# Patient Record
Sex: Female | Born: 1955 | Race: Black or African American | Hispanic: No | Marital: Single | Smoking: Never smoker
Health system: Southern US, Community
[De-identification: ages and names within clinical notes are randomized; demographics above are authoritative.]

## PROBLEM LIST (undated history)

## (undated) DIAGNOSIS — I10 Essential (primary) hypertension: Secondary | ICD-10-CM

---

## 2016-10-12 DIAGNOSIS — M21611 Bunion of right foot: Secondary | ICD-10-CM | POA: Insufficient documentation

## 2016-10-12 DIAGNOSIS — L608 Other nail disorders: Secondary | ICD-10-CM | POA: Insufficient documentation

## 2019-05-18 ENCOUNTER — Other Ambulatory Visit: Payer: Self-pay

## 2019-05-18 DIAGNOSIS — Z1231 Encounter for screening mammogram for malignant neoplasm of breast: Secondary | ICD-10-CM

## 2019-06-09 ENCOUNTER — Ambulatory Visit
Admission: RE | Admit: 2019-06-09 | Discharge: 2019-06-09 | Disposition: A | Discharge status: Home / Self Care | Payer: No Typology Code available for payment source | Source: Ambulatory Visit | Attending: Obstetrics and Gynecology | Admitting: Obstetrics and Gynecology

## 2019-06-09 ENCOUNTER — Ambulatory Visit: Payer: Self-pay | Admitting: Student

## 2019-06-09 ENCOUNTER — Other Ambulatory Visit: Payer: Self-pay

## 2019-06-09 ENCOUNTER — Encounter: Payer: Self-pay | Admitting: Student

## 2019-06-09 ENCOUNTER — Other Ambulatory Visit: Payer: Self-pay | Admitting: Obstetrics and Gynecology

## 2019-06-09 VITALS — BP 158/96 | Temp 97.8°F | Wt 149.0 lb

## 2019-06-09 DIAGNOSIS — N6310 Unspecified lump in the right breast, unspecified quadrant: Secondary | ICD-10-CM

## 2019-06-09 DIAGNOSIS — Z1231 Encounter for screening mammogram for malignant neoplasm of breast: Secondary | ICD-10-CM

## 2019-06-09 DIAGNOSIS — Z01419 Encounter for gynecological examination (general) (routine) without abnormal findings: Secondary | ICD-10-CM

## 2019-06-09 NOTE — Progress Notes (Signed)
[name] is a 64 y.o. No obstetric history on file. female who presents to [facility] clinic today with complaint of lump in right breast that has been present for about a month.    Pap Smear: Pap smear completed today. Last Pap smear was 2016 [place] and was normal. Per patient has no history of an abnormal Pap smear. Last Pap smear result is not available in Epic.   Physical exam: Breasts Breasts symmetrical. No skin abnormalities bilateral breasts. No nipple retraction bilateral breasts. No nipple discharge bilateral breasts. No lymphadenopathy. No lumps palpated bilateral breasts.  Some dense tissue palpated in bilateral outer breasts & right breast next to sternum.       Pelvic/Bimanual Ext Genitalia No lesions, no swelling and no discharge observed on external genitalia.        Vagina Vagina pink and normal texture. No lesions or discharge observed in vagina.        Cervix Cervix is present. Cervix pink and of normal texture. No discharge observed.    Uterus Uterus is present and palpable. Uterus in normal position and normal size.        Adnexae Bilateral ovaries present and palpable. No tenderness on palpation.         Rectovaginal No rectal exam completed today since patient had no rectal complaints. No skin abnormalities observed on exam.     Smoking History: Patient has never smoked.    Patient Navigation: Patient education provided. Access to services provided for patient through [facility] program.    Colorectal Cancer Screening: Per patient has had colonoscopy completed in 2019 No complaints today.    Breast and Cervical Cancer Risk Assessment: Patient does not have family history of breast cancer, known genetic mutations, or radiation treatment to the chest before age 60. Patient does not have history of cervical dysplasia, immunocompromised, or DES exposure in-utero.  Risk Assessment    Risk Scores      06/09/2019   Last edited by: Narda Rutherford, LPN   5-year risk: 1.5 %   Lifetime risk: 6.5 %          A: [facility] exam with pap smear Complaint of right breast lump  P: [facility] for a screening mammogram. Appointment scheduled later today.  Judeth Horn, NP 06/09/2019 1:53 PM

## 2019-06-16 ENCOUNTER — Ambulatory Visit
Admission: RE | Admit: 2019-06-16 | Discharge: 2019-06-16 | Disposition: A | Discharge status: Home / Self Care | Payer: No Typology Code available for payment source | Source: Ambulatory Visit | Attending: Obstetrics and Gynecology | Admitting: Obstetrics and Gynecology

## 2019-06-16 ENCOUNTER — Other Ambulatory Visit: Payer: Self-pay

## 2019-06-16 ENCOUNTER — Ambulatory Visit: Payer: No Typology Code available for payment source

## 2019-06-16 DIAGNOSIS — N6310 Unspecified lump in the right breast, unspecified quadrant: Secondary | ICD-10-CM

## 2019-06-23 LAB — CYTOLOGY - PAP
Adequacy: ABSENT
Comment: NEGATIVE
Diagnosis: NEGATIVE
High risk HPV: NEGATIVE

## 2020-09-15 ENCOUNTER — Telehealth: Payer: Self-pay | Admitting: Internal Medicine

## 2020-09-15 NOTE — Telephone Encounter (Signed)
LVM informing pt provider will be virtual for mondays appt no need to come into office the provider will call phone for the visit

## 2020-09-19 ENCOUNTER — Telehealth (INDEPENDENT_AMBULATORY_CARE_PROVIDER_SITE_OTHER): Payer: Self-pay

## 2020-09-19 ENCOUNTER — Telehealth: Payer: No Typology Code available for payment source | Admitting: Internal Medicine

## 2020-09-19 NOTE — Telephone Encounter (Signed)
Left voicemail informing patient that she will need to establish care first. Then get financial assistance application. Advised to call office back and schedule/reschedule new patient appt. Maryjean Morn, CMA    Copied from CRM [id number]. Topic: Appointment Scheduling - Scheduling Inquiry for Clinic >> Sep 15, 2020  1:16 PM Randol Kern wrote: Reason for CRM: Pt wants to be considered for financial counseling. Please advise   Best contact: [phone]

## 2020-11-10 ENCOUNTER — Encounter: Payer: Self-pay | Admitting: Internal Medicine

## 2020-11-10 ENCOUNTER — Other Ambulatory Visit: Payer: Self-pay

## 2020-11-10 ENCOUNTER — Ambulatory Visit: Payer: 59 | Attending: Internal Medicine | Admitting: Internal Medicine

## 2020-11-10 VITALS — BP 140/90 | HR 88 | Resp 16 | Ht 62.0 in | Wt 143.0 lb

## 2020-11-10 DIAGNOSIS — Z2821 Immunization not carried out because of patient refusal: Secondary | ICD-10-CM | POA: Diagnosis not present

## 2020-11-10 DIAGNOSIS — E663 Overweight: Secondary | ICD-10-CM | POA: Diagnosis not present

## 2020-11-10 DIAGNOSIS — Z1231 Encounter for screening mammogram for malignant neoplasm of breast: Secondary | ICD-10-CM

## 2020-11-10 DIAGNOSIS — R030 Elevated blood-pressure reading, without diagnosis of hypertension: Secondary | ICD-10-CM | POA: Diagnosis not present

## 2020-11-10 DIAGNOSIS — K7689 Other specified diseases of liver: Secondary | ICD-10-CM | POA: Diagnosis not present

## 2020-11-10 DIAGNOSIS — Z1159 Encounter for screening for other viral diseases: Secondary | ICD-10-CM

## 2020-11-10 DIAGNOSIS — Z23 Encounter for immunization: Secondary | ICD-10-CM

## 2020-11-10 DIAGNOSIS — Z7689 Persons encountering health services in other specified circumstances: Secondary | ICD-10-CM

## 2020-11-10 NOTE — Patient Instructions (Signed)
Your blood pressure is elevated.  Normal blood pressure is 120/80 or lower.  Try to limit the salt in the foods as much as possible.  Check your blood pressure at least once a week and record your readings and bring them with you on your next visit.  Healthy Eating Following a healthy eating pattern may help you to achieve and maintain a healthy body weight, reduce the risk of chronic disease, and live a long and productive life. It is important to follow a healthy eating pattern at an appropriate calorie level for your body. Your nutritional needs should be met primarily through food by choosing a variety of nutrient-rich foods. What are tips for following this plan? Reading food labels Read labels and choose the following: Reduced or low sodium. Juices with 100% fruit juice. Foods with low saturated fats and high polyunsaturated and monounsaturated fats. Foods with whole grains, such as whole wheat, cracked wheat, brown rice, and wild rice. Whole grains that are fortified with folic acid. This is recommended for women who are pregnant or who want to become pregnant. Read labels and avoid the following: Foods with a lot of added sugars. These include foods that contain brown sugar, corn sweetener, corn syrup, dextrose, fructose, glucose, high-fructose corn syrup, honey, invert sugar, lactose, malt syrup, maltose, molasses, raw sugar, sucrose, trehalose, or turbinado sugar. Do not eat more than the following amounts of added sugar per day: 6 teaspoons (25 g) for women. 9 teaspoons (38 g) for men. Foods that contain processed or refined starches and grains. Refined grain products, such as white flour, degermed cornmeal, white bread, and white rice. Shopping Choose nutrient-rich snacks, such as vegetables, whole fruits, and nuts. Avoid high-calorie and high-sugar snacks, such as potato chips, fruit snacks, and candy. Use oil-based dressings and spreads on foods instead of solid fats such as  butter, stick margarine, or cream cheese. Limit pre-made sauces, mixes, and "instant" products such as flavored rice, instant noodles, and ready-made pasta. Try more plant-protein sources, such as tofu, tempeh, black beans, edamame, lentils, nuts, and seeds. Explore eating plans such as the Mediterranean diet or vegetarian diet. Cooking Use oil to saut or stir-fry foods instead of solid fats such as butter, stick margarine, or lard. Try baking, boiling, grilling, or broiling instead of frying. Remove the fatty part of meats before cooking. Steam vegetables in water or broth. Meal planning  At meals, imagine dividing your plate into fourths: One-half of your plate is fruits and vegetables. One-fourth of your plate is whole grains. One-fourth of your plate is protein, especially lean meats, poultry, eggs, tofu, beans, or nuts. Include low-fat dairy as part of your daily diet. Lifestyle Choose healthy options in all settings, including home, work, school, restaurants, or stores. Prepare your food safely: Wash your hands after handling raw meats. Keep food preparation surfaces clean by regularly washing with hot, soapy water. Keep raw meats separate from ready-to-eat foods, such as fruits and vegetables. Cook seafood, meat, poultry, and eggs to the recommended internal temperature. Store foods at safe temperatures. In general: Keep cold foods at 52F (4.4C) or below. Keep hot foods at 152F (60C) or above. Keep your freezer at [facility] (-17.8C) or below. Foods are no longer safe to eat when they have been between the temperatures of 40-152F (4.4-60C) for more than 2 hours. What foods should I eat? Fruits Aim to eat 2 cup-equivalents of fresh, canned (in natural juice), or frozen fruits each day. Examples of 1 cup-equivalent of fruit include  1 small apple, 8 large strawberries, 1 cup canned fruit,  cup dried fruit, or 1 cup 100% juice. Vegetables Aim to eat 2-3 cup-equivalents of fresh  and frozen vegetables each day, including different varieties and colors. Examples of 1 cup-equivalent of vegetables include 2 medium carrots, 2 cups raw, leafy greens, 1 cup chopped vegetable (raw or cooked), or 1 medium baked potato. Grains Aim to eat 6 ounce-equivalents of whole grains each day. Examples of 1 ounce-equivalent of grains include 1 slice of bread, 1 cup ready-to-eat cereal, 3 cups popcorn, or  cup cooked rice, pasta, or cereal. Meats and other proteins Aim to eat 5-6 ounce-equivalents of protein each day. Examples of 1 ounce-equivalent of protein include 1 egg, 1/2 cup nuts or seeds, or 1 tablespoon (16 g) peanut butter. A cut of meat or fish that is the size of a deck of cards is about 3-4 ounce-equivalents. Of the protein you eat each week, try to have at least 8 ounces come from seafood. This includes salmon, trout, herring, and anchovies. Dairy Aim to eat 3 cup-equivalents of fat-free or low-fat dairy each day. Examples of 1 cup-equivalent of dairy include 1 cup (240 mL) milk, 8 ounces (250 g) yogurt, 1 ounces (44 g) natural cheese, or 1 cup (240 mL) fortified soy milk. Fats and oils Aim for about 5 teaspoons (21 g) per day. Choose monounsaturated fats, such as canola and olive oils, avocados, peanut butter, and most nuts, or polyunsaturated fats, such as sunflower, corn, and soybean oils, walnuts, pine nuts, sesame seeds, sunflower seeds, and flaxseed. Beverages Aim for six 8-oz glasses of water per day. Limit coffee to three to five 8-oz cups per day. Limit caffeinated beverages that have added calories, such as soda and energy drinks. Limit alcohol intake to no more than 1 drink a day for nonpregnant women and 2 drinks a day for men. One drink equals 12 oz of beer (355 mL), 5 oz of wine (148 mL), or 1 oz of hard liquor (44 mL). Seasoning and other foods Avoid adding excess amounts of salt to your foods. Try flavoring foods with herbs and spices instead of salt. Avoid adding  sugar to foods. Try using oil-based dressings, sauces, and spreads instead of solid fats. This information is based on general [place] nutrition guidelines. For more information, visit [employer].es. Exact amounts may vary based on your nutrition needs. Summary A healthy eating plan may help you to maintain a healthy weight, reduce the risk of chronic diseases, and stay active throughout your life. Plan your meals. Make sure you eat the right portions of a variety of nutrient-rich foods. Try baking, boiling, grilling, or broiling instead of frying. Choose healthy options in all settings, including home, work, school, restaurants, or stores. This information is not intended to replace advice given to you by your health care provider. Make sure you discuss any questions you have with your health care provider. Document Revised: 05/20/2017 Document Reviewed: 05/20/2017 Elsevier Patient Education  [place].

## 2020-11-10 NOTE — Progress Notes (Signed)
Patient ID: Mandy Shelton, female    DOB: Sep 17, 1955  MRN: [id number]  CC: New Patient (Initial Visit)   Subjective: Mandy Shelton is a 65 y.o. female who presents for new pt visit Her concerns today include:    Previous PCP was at a [facility] in [facility] [place].  She states she has not been seen there in a few years.  She now has insurance and lives in [place] so decided to establish with [place].   No chronic medical issues Not on any rxn med  She is overweight for height.  Started exercising again recently and has loss 7 lbs over the past 2 wks.  Goes to a Recreation ctr for swimming and aerobic classes 2 days a week.  Doing good with eating habits.  Getting in fruits and veggies.  Drinking more water  She tells me that she had an MRI of her abdomen in 2013 of 2014 when she lived in [place] because she was having abdominal pain in her left upper abdomen.  Incidental finding was a small cyst on the liver.  She states being told that she may have to keep an eye on that.  She is not having any symptoms at this time.    Blood pressure noted to be elevated today.  She tells me that she was told by her previous doctor that her blood pressure was elevated and was prescribed medication.  However she never took it.  She states when she went back her blood pressure had came down and so she was told to keep an eye on it.  She does have a home blood pressure monitoring device but has not checked it in a while.  HM:  had c-scope in 2020.  1 polyp removed.  Told repeat in 5 yrs Due for MMG.  Had mammogram done back in 2021.  She thought she felt a lump in the left breast but mammogram was negative.  She also had an ultrasound that was negative.  She received a letter informing her that she has dense breasts and that her next mammogram should be 3D tomosynthesis Declines flu shot Agrees to Tdap Declines COVID vaccine Sexually active with same partner since 2014.  Had negative  HIV test at [facility] 04/2020 in [facility]. Agrees to hep C screening Due for pap    Current Outpatient Medications on File Prior to Visit  Medication Sig Dispense Refill   Multiple Vitamins-Minerals (ONE-A-DAY FOR HER VITACRAVES PO) Take 1 tablet by mouth once.     No current facility-administered medications on file prior to visit.    Allergies  Allergen Reactions   Sulfa Antibiotics     Social History   Socioeconomic History   Marital status: Single    Spouse name: Not on file   Number of children: 2   Years of education: Not on file   Highest education level: Bachelor's degree (e.g., BA, AB, BS)  Occupational History   Not on file  Tobacco Use   Smoking status: Never   Smokeless tobacco: Never  Vaping Use   Vaping Use: Not on file  Substance and Sexual Activity   Alcohol use: Yes    Comment: occasionally   Drug use: Never   Sexual activity: Yes    Birth control/protection: Post-menopausal, None  Other Topics Concern   Not on file  Social History Narrative   Not on file   Social Determinants of Health   Financial Resource Strain: Not  on file  Food Insecurity: Not on file  Transportation Needs: Not on file  Physical Activity: Not on file  Stress: Not on file  Social Connections: Not on file  Intimate Partner Violence: Not on file    Family History  Problem Relation Age of Onset   Thyroid disease Mother     ROS: Review of Systems Negative except as stated above  PHYSICAL EXAM: BP 138/87   Pulse 88   Resp 16   Ht 5\' 2"  (1.575 m)   Wt 143 lb (64.9 kg)   SpO2 98%   BMI 26.16 kg/m   Physical Exam  General appearance - alert, well appearing, older African-American female and in no distress Mental status - normal mood, behavior, speech, dress, motor activity, and thought processes Eyes - pupils equal and reactive, extraocular eye movements intact Mouth - mucous membranes moist, pharynx normal without lesions Neck - supple, no significant  adenopathy Chest - clear to auscultation, no wheezes, rales or rhonchi, symmetric air entry Heart - normal rate, regular rhythm, normal S1, S2, no murmurs, rubs, clicks or gallops Abdomen - soft, nontender, nondistended, no masses or organomegaly Extremities - peripheral pulses normal, no pedal edema, no clubbing or cyanosis    ASSESSMENT AND PLAN: 1. Establishing care with new doctor, encounter for Patient to sign a release for to get her records from her previous PCP in [facility].  I also told her to sign a release for [facility] to get the MRI report that was done back in 2014 [place]  2. Elevated blood pressure reading without diagnosis of hypertension DASH diet discussed and encouraged.  Recommend that she check her blood pressure at least once or twice a week and bring her readings with her on next visit. - CBC - Comprehensive metabolic panel  3. Over weight Commended her on starting an exercise program.  Informed her that the goal is to get in at least 150 minutes/week total of moderate intensity exercise.  Dietary counseling given.  Printed information also given. - Comprehensive metabolic panel - Lipid panel  4. Need for Tdap vaccination Given today.  5. Need for hepatitis C screening test - Hepatitis C Antibody  6. Liver cyst Patient will try to get [place] a copy of the MRI report.  However I told her that if it was a small cyst then usually no further follow-up is needed as those are usually benign.  We will check baseline blood test today including CMP.  7. Encounter for screening mammogram for malignant neoplasm of breast Inform patient that I think the baseline mammograms now are 3D but she can verify with this person who will call her from [facility] imaging to schedule her mammography. - MM Digital Screening; Future  8. Influenza vaccination declined Strongly advised.  Patient declined.  9. COVID-19 vaccination declined Strongly advised.  Patient  declined.   Patient was given the opportunity to ask questions.  Patient verbalized understanding of the plan and was able to repeat key elements of the plan.   Orders Placed This Encounter  Procedures   MM Digital Screening   Tdap vaccine greater than or equal to 7yo IM   CBC   Comprehensive metabolic panel   Lipid panel   Hepatitis C Antibody     Requested Prescriptions    No prescriptions requested or ordered in this encounter    Return in about 6 weeks (around 12/22/2020), or for pap, for Sign release get your records from the [facility] in [facility].  Jonah Blue, MD, FACP

## 2020-11-11 LAB — COMPREHENSIVE METABOLIC PANEL
ALT: 15 IU/L (ref 0–32)
AST: 26 IU/L (ref 0–40)
Albumin/Globulin Ratio: 1.4 (ref 1.2–2.2)
Albumin: 4.7 g/dL (ref 3.8–4.8)
Alkaline Phosphatase: 65 IU/L (ref 44–121)
BUN/Creatinine Ratio: 10 — ABNORMAL LOW (ref 12–28)
BUN: 12 mg/dL (ref 8–27)
Bilirubin Total: 0.3 mg/dL (ref 0.0–1.2)
CO2: 25 mmol/L (ref 20–29)
Calcium: 9.9 mg/dL (ref 8.7–10.3)
Chloride: 99 mmol/L (ref 96–106)
Creatinine, Ser: 1.15 mg/dL — ABNORMAL HIGH (ref 0.57–1.00)
Globulin, Total: 3.4 g/dL (ref 1.5–4.5)
Glucose: 94 mg/dL (ref 65–99)
Potassium: 4.9 mmol/L (ref 3.5–5.2)
Sodium: 138 mmol/L (ref 134–144)
Total Protein: 8.1 g/dL (ref 6.0–8.5)
eGFR: 53 mL/min/{1.73_m2} — ABNORMAL LOW (ref >59)

## 2020-11-11 LAB — LIPID PANEL
Chol/HDL Ratio: 3.2 ratio (ref 0.0–4.4)
Cholesterol, Total: 218 mg/dL — ABNORMAL HIGH (ref 100–199)
HDL: 69 mg/dL (ref >39)
LDL Chol Calc (NIH): 132 mg/dL — ABNORMAL HIGH (ref 0–99)
Triglycerides: 99 mg/dL (ref 0–149)
VLDL Cholesterol Cal: 17 mg/dL (ref 5–40)

## 2020-11-11 LAB — CBC
Hematocrit: 39 % (ref 34.0–46.6)
Hemoglobin: 13 g/dL (ref 11.1–15.9)
MCH: 29.7 pg (ref 26.6–33.0)
MCHC: 33.3 g/dL (ref 31.5–35.7)
MCV: 89 fL (ref 79–97)
Platelets: 268 10*3/uL (ref 150–450)
RBC: 4.37 x10E6/uL (ref 3.77–5.28)
RDW: 12.5 % (ref 11.7–15.4)
WBC: 5.7 10*3/uL (ref 3.4–10.8)

## 2020-11-11 LAB — HEPATITIS C ANTIBODY: Hep C Virus Ab: <0.1 s/co ratio (ref 0.0–0.9)

## 2020-11-12 NOTE — Progress Notes (Signed)
Blood cell counts are normal. Liver function test normal. Kidney function not 100%.  We will plan to recheck on follow up visit in about 4 mths.  Avoid long term use of over the counter pain meds including Motrin, Advil, Aleve, Ibuprofen. Cholesterol level is 132 with goal being less than 100.  Healthy eating habits and regular exercise will help to lower cholesterol. Hep C screen negative. The rest of this is for my information The 10-year ASCVD risk score (Arnett DK, et al., 2019) is: 9.2%   Values used to calculate the score:     Age: 65 years     Sex: Female     Is Non-Hispanic African American: Yes     Diabetic: No     Tobacco smoker: No     Systolic Blood Pressure: 140 mmHg     Is BP treated: No     HDL Cholesterol: 69 mg/dL     Total Cholesterol: 218 mg/dL

## 2020-11-14 ENCOUNTER — Telehealth: Payer: Self-pay

## 2020-11-14 NOTE — Telephone Encounter (Signed)
Contacted pt to go over lab results pt is aware and doesn't have any questions or concerns 

## 2021-01-09 ENCOUNTER — Ambulatory Visit: Payer: No Typology Code available for payment source | Admitting: Internal Medicine

## 2021-01-10 ENCOUNTER — Other Ambulatory Visit: Payer: Self-pay

## 2021-01-10 ENCOUNTER — Ambulatory Visit
Admission: RE | Admit: 2021-01-10 | Discharge: 2021-01-10 | Disposition: A | Discharge status: Home / Self Care | Payer: 59 | Source: Ambulatory Visit | Attending: Internal Medicine | Admitting: Internal Medicine

## 2021-01-10 DIAGNOSIS — Z1231 Encounter for screening mammogram for malignant neoplasm of breast: Secondary | ICD-10-CM

## 2021-02-23 ENCOUNTER — Ambulatory Visit: Payer: Self-pay | Admitting: Internal Medicine

## 2021-04-27 ENCOUNTER — Ambulatory Visit: Payer: 59 | Attending: Internal Medicine | Admitting: Internal Medicine

## 2021-04-27 ENCOUNTER — Ambulatory Visit (HOSPITAL_COMMUNITY)
Admission: RE | Admit: 2021-04-27 | Discharge: 2021-04-27 | Disposition: A | Discharge status: Home / Self Care | Payer: 59 | Source: Ambulatory Visit | Attending: Internal Medicine | Admitting: Internal Medicine

## 2021-04-27 ENCOUNTER — Other Ambulatory Visit: Payer: Self-pay

## 2021-04-27 ENCOUNTER — Encounter: Payer: Self-pay | Admitting: Internal Medicine

## 2021-04-27 ENCOUNTER — Other Ambulatory Visit (HOSPITAL_COMMUNITY)
Admission: RE | Admit: 2021-04-27 | Discharge: 2021-04-27 | Disposition: A | Discharge status: Home / Self Care | Payer: 59 | Source: Ambulatory Visit | Attending: Internal Medicine | Admitting: Internal Medicine

## 2021-04-27 VITALS — BP 145/96 | HR 72 | Ht 62.0 in | Wt 143.2 lb

## 2021-04-27 DIAGNOSIS — I10 Essential (primary) hypertension: Secondary | ICD-10-CM

## 2021-04-27 DIAGNOSIS — Z124 Encounter for screening for malignant neoplasm of cervix: Secondary | ICD-10-CM | POA: Insufficient documentation

## 2021-04-27 DIAGNOSIS — K7689 Other specified diseases of liver: Secondary | ICD-10-CM

## 2021-04-27 DIAGNOSIS — M25562 Pain in left knee: Secondary | ICD-10-CM | POA: Insufficient documentation

## 2021-04-27 MED ORDER — AMLODIPINE BESYLATE 5 MG PO TABS: 5.0000 mg | ORAL_TABLET | Freq: Every day | ORAL | 5 refills | Status: DC

## 2021-04-27 NOTE — Progress Notes (Signed)
? ? ?Patient ID: Mandy Shelton, female    DOB: [birth year]  MRN: [id number] ? ?CC: Follow-up (Follow up and pap) ? ? ?Subjective: ?Mandy Shelton is a 66 y.o. female who presents for f/u and PAP.  Last seen 10/2020 ?Her concerns today include:  ?Pt with overwgh, liver cyst, elev BP ? ?GYN History:  ?Pt is G5P2 (3 abortions) ?Any hx of abn paps?: no.  Last PAP 05/2019 was negative but transformation zone was absent ?Menses regular or irregular?: menopausal.  No post menopausal bleeding ?How long does menses last? NA ?Menstrual flow light or heavy?:  ?Method of birth control?:  NA ?Any vaginal dischg at this time?: no ?Dysuria?: no ?Any hx of STI?:  hx of Trich, GN and Chlamydia. Last was 2014 ?Sexually active with how many partners: one female partner since 2015 ?Desires STI screen: yes ?Last MMG:  12/2020 ?Family hx of uterine, cervical or breast cancer?:  no ?  ?BP elev on last visit and again today.  She has not been checking lately ?Limiting salt in foods ?Joined gym end of January 2023.  Going 5 days a wk.  Walks on TM for 45 mins.  Some stiffness and pain in LT knee x 1 mth. Sometimes it feels heavy.  Not sure if any swelling. Pain over knee cap and behind knee ? ?Question about liver cyst. On last visit pt reported the following:   ?She tells me that she had an MRI of her abdomen in 2013 of 2014 when she lived in [place] because she was having abdominal pain in her left upper abdomen.  Incidental finding was a small cyst on the liver.  She states being told that she may have to keep an eye on that.  She is not having any symptoms at this time.   ?-On that visit I told patient that it would be good if we can get a copy of the MRI reports.  Usually if it is a small cyst on the liver, no further work-up is needed as these are usually benign.  I requested that she sign a release for [place] to get the report.  However patient states she forgot to do so when she went through checkout. ? ?Had c-scope in early 2019 at [facility].  I do not see the colonoscopy report but I see where she did meet with GI and there is a subsequent telephone note dated 06/19/2017 stating that benign precancerous polyp and that she needs a repeat colonoscopy in 5 years.   ? ?Current Outpatient Medications on File Prior to Visit  ?Medication Sig Dispense Refill  ? Calcium Carb-Cholecalciferol 600-20 MG-MCG TABS calcium carbonate-vitamin D3 600 mg (1,500 mg)-800 unit tablet ? Take 1 tablet twice a day by oral route for 30 days.    ? Multiple Vitamins-Minerals (ONE-A-DAY FOR HER VITACRAVES PO) Take 1 tablet by mouth once.    ? ?No current facility-administered medications on file prior to visit.  ? ? ?Allergies  ?Allergen Reactions  ? Sulfa Antibiotics   ? ? ?Social History  ? ?Socioeconomic History  ? Marital status: Single  ?  Spouse name: Not on file  ? Number of children: 2  ? Years of education: Not on file  ? Highest education level: Bachelor's degree (e.g., BA, AB, BS)  ?Occupational History  ? Not on file  ?Tobacco Use  ? Smoking status: Never  ? Smokeless tobacco: Never  ?Vaping Use  ? Vaping Use: Not on file  ?  Substance and Sexual Activity  ? Alcohol use: Yes  ?  Comment: occasionally  ? Drug use: Never  ? Sexual activity: Yes  ?  Birth control/protection: Post-menopausal, None  ?Other Topics Concern  ? Not on file  ?Social History Narrative  ? Not on file  ? ?Social Determinants of Health  ? ?Financial Resource Strain: Not on file  ?Food Insecurity: Not on file  ?Transportation Needs: Not on file  ?Physical Activity: Not on file  ?Stress: Not on file  ?Social Connections: Not on file  ?Intimate Partner Violence: Not on file  ? ? ?Family History  ?Problem Relation Age of Onset  ? Thyroid disease Mother   ? Breast cancer Neg Hx   ? ? ?No past surgical history on file. ? ?ROS: ?Review of Systems ?Negative except as stated above ? ?PHYSICAL EXAM: ?BP (!) 145/96   Pulse 72   Ht 5\' 2"  (1.575 m)   Wt 143 lb 3.2 oz (65 kg)   SpO2 100%   BMI  26.19 kg/m?   ?Wt Readings from Last 3 Encounters:  ?04/27/21 143 lb 3.2 oz (65 kg)  ?11/10/20 143 lb (64.9 kg)  ?06/09/19 149 lb (67.6 kg)  ? ? ?Physical Exam ?BP 167/92 ?General appearance - alert, well appearing, and in no distress ?Mental status - normal mood, behavior, speech, dress, motor activity, and thought processes ?Chest - clear to auscultation, no wheezes, rales or rhonchi, symmetric air entry ?Heart - normal rate, regular rhythm, normal S1, S2, no murmurs, rubs, clicks or gallops ?Breasts - CMA Carly Warner Mccreedy present for breast and pelvic exam:  breasts appear normal, no suspicious masses, no skin or nipple changes or axillary nodes ?Pelvic - normal external genitalia, vulva, vagina, cervix, uterus and adnexa ? ? ?CMP Latest Ref Rng & Units 11/10/2020  ?Glucose 65 - 99 mg/dL 94  ?BUN 8 - 27 mg/dL 12  ?Creatinine 0.57 - 1.00 mg/dL 1.15(H)  ?Sodium 134 - 144 mmol/L 138  ?Potassium 3.5 - 5.2 mmol/L 4.9  ?Chloride 96 - 106 mmol/L 99  ?CO2 20 - 29 mmol/L 25  ?Calcium 8.7 - 10.3 mg/dL 9.9  ?Total Protein 6.0 - 8.5 g/dL 8.1  ?Total Bilirubin 0.0 - 1.2 mg/dL 0.3  ?Alkaline Phos 44 - 121 IU/L 65  ?AST 0 - 40 IU/L 26  ?ALT 0 - 32 IU/L 15  ? ?Lipid Panel  ?   ?Component Value Date/Time  ? CHOL 218 (H) 11/10/2020 1533  ? TRIG 99 11/10/2020 1533  ? HDL 69 11/10/2020 1533  ? CHOLHDL 3.2 11/10/2020 1533  ? LDLCALC 132 (H) 11/10/2020 1533  ? ? ?CBC ?   ?Component Value Date/Time  ? WBC 5.7 11/10/2020 1533  ? RBC 4.37 11/10/2020 1533  ? HGB 13.0 11/10/2020 1533  ? HCT 39.0 11/10/2020 1533  ? PLT 268 11/10/2020 1533  ? MCV 89 11/10/2020 1533  ? MCH 29.7 11/10/2020 1533  ? MCHC 33.3 11/10/2020 1533  ? RDW 12.5 11/10/2020 1533  ? ? ?ASSESSMENT AND PLAN: ? ?1. Pap smear for cervical cancer screening ?- Cervicovaginal ancillary only ?- Cytology - PAP ? ?2. Essential hypertension ?DASH diet discussed and encouraged.  Start low-dose amlodipine.  Advised to check blood pressure at least once a week if she has access to her device  with goal being 130/80 or lower. ?F/u with clinical pharmacist in 1 mth for repeat BP check ?- amLODipine (NORVASC) 5 MG tablet; Take 1 tablet (5 mg total) by mouth daily.  Dispense: 30  tablet; Refill: 5 ? ?3. Left knee pain, unspecified chronicity ?Questionable etiology.  Could be mild arthritis being manifested now that she is trying to be more active.  Encouraged to stay active ?We will start with x-rays of the knee.  ? I recommend purchasing Voltaren gel over-the-counter and using it as needed. ?- DG Knee Complete 4 Views Left; Future ? ?4. Liver cyst ?Advised patient to sign a release at checkout for [place] to get her records from [place].  She is agreeable to doing that. ? ? ? ?Patient was given the opportunity to ask questions.  Patient verbalized understanding of the plan and was able to repeat key elements of the plan.  ? ?This documentation was completed using Radio producer.  Any transcriptional errors are unintentional. ? ?No orders of the defined types were placed in this encounter. ? ? ? ?Requested Prescriptions  ? ? No prescriptions requested or ordered in this encounter  ? ? ?No follow-ups on file. ? ?Karle Plumber, MD, FACP ?

## 2021-04-27 NOTE — Patient Instructions (Signed)
You can go to the radiology department at [facility] to get the x-ray done on the left knee.  You can go any day except on the weekends. ?Purchase Voltaren gel over-the-counter and use it on the left knee as needed for pain. ? ?Please sign a release for [place] to get your records including the CAT scan report about the liver cysts. ? ?We have started you on a blood pressure medication called amlodipine 5 mg daily.  We will have you follow-up with our clinical pharmacist in 1 month for repeat blood pressure check. ? ? ?

## 2021-04-28 LAB — CERVICOVAGINAL ANCILLARY ONLY
Bacterial Vaginitis (gardnerella): POSITIVE — AB
Candida Glabrata: NEGATIVE
Candida Vaginitis: POSITIVE — AB
Chlamydia: NEGATIVE
Comment: NEGATIVE
Comment: NEGATIVE
Comment: NEGATIVE
Comment: NEGATIVE
Comment: NEGATIVE
Comment: NORMAL
Neisseria Gonorrhea: NEGATIVE
Trichomonas: NEGATIVE

## 2021-04-29 ENCOUNTER — Other Ambulatory Visit: Payer: Self-pay | Admitting: Internal Medicine

## 2021-04-29 MED ORDER — FLUCONAZOLE 150 MG PO TABS: 150.0000 mg | ORAL_TABLET | Freq: Every day | ORAL | 0 refills | Status: DC

## 2021-04-29 MED ORDER — METRONIDAZOLE 500 MG PO TABS: 500.0000 mg | ORAL_TABLET | Freq: Two times a day (BID) | ORAL | 0 refills | Status: DC

## 2021-05-01 LAB — CYTOLOGY - PAP
Comment: NEGATIVE
Diagnosis: NEGATIVE
High risk HPV: NEGATIVE

## 2021-05-02 ENCOUNTER — Telehealth: Payer: Self-pay

## 2021-05-02 ENCOUNTER — Ambulatory Visit: Payer: Self-pay

## 2021-05-02 NOTE — Telephone Encounter (Signed)
Patient name and DOB verified. Aware of results of lab test.  ?And what medications are prescribed for.  ?

## 2021-05-02 NOTE — Telephone Encounter (Signed)
Called patient reviewed all information and repeated back to me. Will call if any questions.  ?Will pick up prescription today. ?

## 2021-05-02 NOTE — Telephone Encounter (Signed)
Left message to return call to our office.  

## 2021-05-02 NOTE — Telephone Encounter (Signed)
Pt returned call. Shared result notes. ? ?Ladell Pier, MD  ?04/28/2021  8:45 AM EST   ?  ?Let patient know that x-ray of the knee showed mild arthritis changes.  She should use the Voltaren gel which can be purchased over-the-counter as discussed on recent visit.  If no relief with that, she can let me know so that we can refer to orthopedics.  ? ? ?Pt will call back if Voltaren is ineffective. ?

## 2021-05-02 NOTE — Telephone Encounter (Signed)
?  Chief Complaint: Medication questions ?Symptoms: na ?Frequency: na ?Pertinent Negatives: Patient denies na ?Disposition: [] ED /[] Urgent Care (no appt availability in office) / [] Appointment(In office/virtual)/ []  [place] [facility]/ [] Home Care/ [] Refused Recommended Disposition /[] [place] [facility]/ [x]  Follow-up with PCP ?Additional Notes: 2 medications were called into [facility] - pt does not know what they are for. Please return her call. Regarding diagnosis and treatment. ? ?Reason for Disposition ? [1] Caller has NON-URGENT medicine question about med that PCP prescribed AND [2] triager unable to answer question ? ?Answer Assessment - Initial Assessment Questions ?1. NAME of MEDICATION: "What medicine are you calling about?" ?    fluconazole (DIFLUCAN) 150 MG tablet   ?metroNIDAZOLE (FLAGYL) 500 MG tablet  ?2. QUESTION: "What is your question?" (e.g., double dose of medicine, side effect) ?    Why were these medications called in? ?3. PRESCRIBING HCP: "Who prescribed it?" Reason: if prescribed by specialist, call should be referred to that group. ?    Dr. ?4. SYMPTOMS: "Do you have any symptoms?" ?    na ?5. SEVERITY: If symptoms are present, ask "Are they mild, moderate or severe?" ?    na ?6. PREGNANCY:  "Is there any chance that you are pregnant?" "When was your last menstrual period?" ?    na ? ?Protocols used: Medication Question Call-A-AH ? ?

## 2021-05-03 ENCOUNTER — Telehealth: Payer: Self-pay

## 2021-05-03 NOTE — Telephone Encounter (Signed)
Contacted pt to go over lab and xray results pt doesn't have any questions or concerns  ?

## 2021-05-25 ENCOUNTER — Encounter: Payer: Self-pay | Admitting: Pharmacist

## 2021-05-25 ENCOUNTER — Ambulatory Visit: Payer: 59 | Attending: Internal Medicine | Admitting: Pharmacist

## 2021-05-25 VITALS — BP 147/77

## 2021-05-25 DIAGNOSIS — I10 Essential (primary) hypertension: Secondary | ICD-10-CM | POA: Diagnosis not present

## 2021-05-25 MED ORDER — AMLODIPINE BESYLATE 10 MG PO TABS: 10.0000 mg | ORAL_TABLET | Freq: Every day | ORAL | 1 refills | Status: DC

## 2021-05-25 NOTE — Progress Notes (Signed)
? ?  S:    ? ?No chief complaint on file. ? ? ?Mandy Shelton is a 66 y.o. female who presents for hypertension evaluation, education, and management. PMH is significant for HTN. Patient was referred and last seen by Primary Care Provider, Dr. Wynetta Emery, on 04/27/21. BP was 145/96 mmHg so Dr. Wynetta Emery started amlodipine 5 mg daily.   ? ?Today, patient arrives in good spirits and presents without assistance. Denies dizziness, headache, swelling. Does endorse blurred vision.  ? ?Patient reports hypertension was diagnosed ~2017. ? ?Family/Social history:  ?Fhx: thyroid disease ?Tobacco: never smoker  ?Alcohol: none reported ? ?Medication adherence reported. Patient has taken BP medications today.  ? ?Current antihypertensives include: amlodipine 5 mg daily  ? ?Reported home BP readings:  ?-None  ? ?Patient reported dietary habits:  ?-Sodium: compliant with salt restriction for the most part. Uses Mrs. Dash here lately.  ?-Caffeine: drinks coffee in the mornings  ? ?Patient-reported exercise habits:  ?-Joined [employer], 45 minutes on the treadmill  ? ?O:  ?Vitals:  ? 05/25/21 1435  ?BP: (!) 147/77  ? ?Last 3 Office BP readings: ?BP Readings from Last 3 Encounters:  ?05/25/21 (!) 147/77  ?04/27/21 (!) 145/96  ?11/10/20 140/90  ? ?BMET ?   ?Component Value Date/Time  ? NA 138 11/10/2020 1533  ? K 4.9 11/10/2020 1533  ? CL 99 11/10/2020 1533  ? CO2 25 11/10/2020 1533  ? GLUCOSE 94 11/10/2020 1533  ? BUN 12 11/10/2020 1533  ? CREATININE 1.15 (H) 11/10/2020 1533  ? CALCIUM 9.9 11/10/2020 1533  ? ? ?Renal function: ?CrCl cannot be calculated (Patient's most recent lab result is older than the maximum 21 days allowed.). ? ?Clinical ASCVD: No  ?The 10-year ASCVD risk score (Arnett DK, et al., 2019) is: 12.2% ?  Values used to calculate the score: ?    Age: 60 years ?    Sex: Female ?    Is Non-Hispanic African American: Yes ?    Diabetic: No ?    Tobacco smoker: No ?    Systolic Blood Pressure: [id number] mmHg ?    Is BP treated: Yes ?     HDL Cholesterol: 69 mg/dL ?    Total Cholesterol: 218 mg/dL ? ?A/P: ?Hypertension diagnosed currently above goal on current medications. BP goal < 130/80 mmHg. Medication adherence appears appropriate.  ?-Increased dose of amlodipine to 10 mg daily.  ?-Counseled on lifestyle modifications for blood pressure control including reduced dietary sodium, increased exercise, adequate sleep. ?-Encouraged patient to check BP at home and bring log of readings to next visit. Counseled on proper use of home BP cuff.  ? ?Results reviewed and written information provided. Patient verbalized understanding of treatment plan. Total time in face-to-face counseling 30 minutes.  ? ?F/u clinic visit in 1 month. ? ?Benard Halsted, PharmD, BCACP, CPP ?Clinical Pharmacist ?[place] ?[phone] ? ? ?

## 2021-06-29 ENCOUNTER — Ambulatory Visit: Payer: Medicare Other | Admitting: Pharmacist

## 2021-07-25 DIAGNOSIS — Z113 Encounter for screening for infections with a predominantly sexual mode of transmission: Secondary | ICD-10-CM | POA: Diagnosis not present

## 2021-07-25 DIAGNOSIS — N390 Urinary tract infection, site not specified: Secondary | ICD-10-CM | POA: Diagnosis not present

## 2021-07-31 ENCOUNTER — Ambulatory Visit: Payer: Self-pay

## 2021-07-31 NOTE — Telephone Encounter (Signed)
  Chief Complaint: Back pain Symptoms: Back pain on right lower back 9/10,  a little nauseous Frequency: since yesterday Pertinent Negatives: Patient denies fever Disposition: [x] ED /[] Urgent Care (no appt availability in office) / [] Appointment(In office/virtual)/ []  [place] [facility]/ [] Home Care/ [] Refused Recommended Disposition /[] [place] [facility]/ []  Follow-up with PCP Additional Notes: PT was sen at HD for UTI and given medications on Friday. Pt now has 9/10 back pain. Reason for Disposition  [1] SEVERE back pain (e.g., excruciating) AND [2] sudden onset AND [3] age > 60 years  Answer Assessment - Initial Assessment Questions 1. ONSET: "When did the pain begin?"      2015 - was diagnosed with spot of liver 2. LOCATION: "Where does it hurt?" (upper, mid or lower back)     Lower right side 3. SEVERITY: "How bad is the pain?"  (e.g., Scale 1-10; mild, moderate, or severe)   - MILD (1-3): doesn't interfere with normal activities    - MODERATE (4-7): interferes with normal activities or awakens from sleep    - SEVERE (8-10): excruciating pain, unable to do any normal activities      9/10 4. PATTERN: "Is the pain constant?" (e.g., yes, no; constant, intermittent)      Constant 5. RADIATION: "Does the pain shoot into your legs or elsewhere?"     Dose not radiate 6. CAUSE:  "What do you think is causing the back pain?"      unsure 7. BACK OVERUSE:  "Any recent lifting of heavy objects, strenuous work or exercise?"     no 8. MEDICATIONS: "What have you taken so far for the pain?" (e.g., nothing, acetaminophen, NSAIDS)     no 9. NEUROLOGIC SYMPTOMS: "Do you have any weakness, numbness, or problems with bowel/bladder control?"     no 10. OTHER SYMPTOMS: "Do you have any other symptoms?" (e.g., fever, abdominal pain, burning with urination, blood in urine)       no 11. PREGNANCY: "Is there any chance you are pregnant?" (e.g., yes, no; LMP)       no  Protocols used: Back  Pain-A-AH

## 2021-08-02 ENCOUNTER — Emergency Department (HOSPITAL_COMMUNITY): Payer: Medicare Other

## 2021-08-02 ENCOUNTER — Emergency Department (HOSPITAL_COMMUNITY)
Admission: EM | Admit: 2021-08-02 | Discharge: 2021-08-03 | Discharge status: Against Medical Advice | Payer: Medicare Other | Attending: Emergency Medicine | Admitting: Emergency Medicine

## 2021-08-02 ENCOUNTER — Other Ambulatory Visit: Payer: Self-pay

## 2021-08-02 DIAGNOSIS — Z5321 Procedure and treatment not carried out due to patient leaving prior to being seen by health care provider: Secondary | ICD-10-CM | POA: Insufficient documentation

## 2021-08-02 DIAGNOSIS — R109 Unspecified abdominal pain: Secondary | ICD-10-CM | POA: Diagnosis not present

## 2021-08-02 DIAGNOSIS — R1031 Right lower quadrant pain: Secondary | ICD-10-CM | POA: Diagnosis not present

## 2021-08-02 DIAGNOSIS — M25551 Pain in right hip: Secondary | ICD-10-CM | POA: Diagnosis not present

## 2021-08-02 DIAGNOSIS — N281 Cyst of kidney, acquired: Secondary | ICD-10-CM | POA: Diagnosis not present

## 2021-08-02 DIAGNOSIS — K760 Fatty (change of) liver, not elsewhere classified: Secondary | ICD-10-CM | POA: Diagnosis not present

## 2021-08-02 LAB — CBC WITH DIFFERENTIAL/PLATELET
Abs Immature Granulocytes: 0.01 10*3/uL (ref 0.00–0.07)
Basophils Absolute: 0.1 10*3/uL (ref 0.0–0.1)
Basophils Relative: 1 %
Eosinophils Absolute: 0.2 10*3/uL (ref 0.0–0.5)
Eosinophils Relative: 2 %
HCT: 39.4 % (ref 36.0–46.0)
Hemoglobin: 12.8 g/dL (ref 12.0–15.0)
Immature Granulocytes: 0 %
Lymphocytes Relative: 49 %
Lymphs Abs: 3.2 10*3/uL (ref 0.7–4.0)
MCH: 29.4 pg (ref 26.0–34.0)
MCHC: 32.5 g/dL (ref 30.0–36.0)
MCV: 90.4 fL (ref 80.0–100.0)
Monocytes Absolute: 0.5 10*3/uL (ref 0.1–1.0)
Monocytes Relative: 8 %
Neutro Abs: 2.7 10*3/uL (ref 1.7–7.7)
Neutrophils Relative %: 40 %
Platelets: 222 10*3/uL (ref 150–400)
RBC: 4.36 MIL/uL (ref 3.87–5.11)
RDW: 12.3 % (ref 11.5–15.5)
WBC: 6.6 10*3/uL (ref 4.0–10.5)
nRBC: 0 % (ref 0.0–0.2)

## 2021-08-02 LAB — COMPREHENSIVE METABOLIC PANEL
ALT: 16 U/L (ref 0–44)
AST: 19 U/L (ref 15–41)
Albumin: 4 g/dL (ref 3.5–5.0)
Alkaline Phosphatase: 48 U/L (ref 38–126)
Anion gap: 10 (ref 5–15)
BUN: 9 mg/dL (ref 8–23)
CO2: 24 mmol/L (ref 22–32)
Calcium: 9.3 mg/dL (ref 8.9–10.3)
Chloride: 103 mmol/L (ref 98–111)
Creatinine, Ser: 0.98 mg/dL (ref 0.44–1.00)
GFR, Estimated: >60 mL/min (ref >60)
Glucose, Bld: 90 mg/dL (ref 70–99)
Potassium: 4 mmol/L (ref 3.5–5.1)
Sodium: 137 mmol/L (ref 135–145)
Total Bilirubin: 0.8 mg/dL (ref 0.3–1.2)
Total Protein: 7.7 g/dL (ref 6.5–8.1)

## 2021-08-02 LAB — URINALYSIS, ROUTINE W REFLEX MICROSCOPIC
Bilirubin Urine: NEGATIVE
Glucose, UA: NEGATIVE mg/dL
Hgb urine dipstick: NEGATIVE
Ketones, ur: NEGATIVE mg/dL
Leukocytes,Ua: NEGATIVE
Nitrite: NEGATIVE
Protein, ur: NEGATIVE mg/dL
Specific Gravity, Urine: 1.006 (ref 1.005–1.030)
pH: 6 (ref 5.0–8.0)

## 2021-08-02 MED ORDER — IOHEXOL 300 MG/ML  SOLN
100.0000 mL | Freq: Once | INTRAMUSCULAR | Status: AC | PRN
  Administered 2021-08-02: 100 mL via INTRAVENOUS

## 2021-08-02 NOTE — ED Provider Triage Note (Signed)
Emergency Medicine Provider Triage Evaluation Note  Mandy Shelton , a 66 y.o. female  was evaluated in triage.  Pt complains of right lower quadrant and right-sided flank pain worse over the past 3 days.  States this has been an ongoing issue.  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  She states she had an MRI and the distant past and stated she had a spot on the liver that needed to be monitored.  Review of Systems  Positive: As above Negative: As above  Physical Exam  BP (!) 148/91 (BP Location: Right Arm)   Pulse 81   Temp 98.4 F (36.9 C) (Oral)   Resp 16   SpO2 100%  Gen:   Awake, no distress   Resp:  Normal effort  MSK:   Moves extremities without difficulty  Other:  Right lower quadrant abdominal pain  Medical Decision Making  Medically screening exam initiated at 4:50 PM.  Appropriate orders placed.  Mandy Shelton was informed that the remainder of the evaluation will be completed by another provider, this initial triage assessment does not replace that evaluation, and the importance of remaining in the ED until their evaluation is complete.     Marita [place], PA-C 08/02/21 1651

## 2021-08-02 NOTE — ED Triage Notes (Signed)
Pt from home for eval of RLQ, R hip, and R flank pain x several years, worsening in the last three days.

## 2021-08-03 ENCOUNTER — Telehealth: Payer: Self-pay | Admitting: Internal Medicine

## 2021-08-03 ENCOUNTER — Ambulatory Visit: Payer: Medicare Other | Attending: Internal Medicine | Admitting: Pharmacist

## 2021-08-03 VITALS — BP 142/84 | HR 71

## 2021-08-03 DIAGNOSIS — I10 Essential (primary) hypertension: Secondary | ICD-10-CM | POA: Diagnosis not present

## 2021-08-03 DIAGNOSIS — H538 Other visual disturbances: Secondary | ICD-10-CM

## 2021-08-03 NOTE — Telephone Encounter (Signed)
-----   Message from Drucilla Chalet, RPH-CPP sent at 08/03/2021  2:40 PM EDT ----- Mandy Shelton Dr. Laural Benes,   We have been seeing this patient for BP management. Today, she endorses occasional blurred vision but denies any dizziness or HA. She tells me this started way before she was having "issues with my blood pressure" but it doesn't seem like she has discussed this during office visits. Would you be willing to make an ophthalmology referral?

## 2021-08-03 NOTE — ED Notes (Signed)
Pt left due to long wait times  

## 2021-08-03 NOTE — Progress Notes (Signed)
   S:   Mandy Shelton is a 66 y.o. female who presents for hypertension evaluation, education, and management. PMH is significant for HTN (since ~2017). Patient was referred and last seen by Primary Care Provider, Dr. Laural Benes, on 04/27/21. Last seen by pharmacist on 05/25/2021 where patient was tolerating amlodpine start and dose was increased to 10mg . Of note, patient called nurse triage line on 07/31/2021 with back pain and was referred to ED based on screening questions. Yesterday, patient presented to ED for excruciating pain but left before seen due to long wait.   Today, patient arrives in good spirits and presents without assistance. Patient's pain has improved without OTC pain medications since yesterday. Since last visit, has been taking amlodipine daily, but cuts into 1/2 tablet (5mg ) most days. Of note, during yesterday took 10mg  dose, at ED visit BP 125/47. Has not checked home BP. Denies dizziness, headache, swelling.    Current antihypertensives include: amlodipine 10 mg daily   Family/Social history:  Fhx: thyroid disease Tobacco: never smoker  Alcohol: none reported  O:  Vitals:   08/03/21 1446  BP: (!) 142/84  Pulse: 71  Took amlodipine 5mg  (1/2 tablet) today  Last 3 Office BP readings: BP Readings from Last 3 Encounters:  08/03/21 (!) 142/84  08/03/21 125/74  05/25/21 (!) 147/77   BMET    Component Value Date/Time   NA 137 08/02/2021 1654   NA 138 11/10/2020 1533   K 4.0 08/02/2021 1654   CL 103 08/02/2021 1654   CO2 24 08/02/2021 1654   GLUCOSE 90 08/02/2021 1654   BUN 9 08/02/2021 1654   BUN 12 11/10/2020 1533   CREATININE 0.98 08/02/2021 1654   CALCIUM 9.3 08/02/2021 1654   GFRNONAA >60 08/02/2021 1654   Renal function: CrCl cannot be calculated (Unknown ideal weight.).  Clinical ASCVD: No  The 10-year ASCVD risk score (Arnett DK, et al., 2019) is: 11.9%   Values used to calculate the score:     Age: 77 years     Sex: Female     Is Non-Hispanic  African American: Yes     Diabetic: No     Tobacco smoker: No     Systolic Blood Pressure: 142 mmHg     Is BP treated: Yes     HDL Cholesterol: 69 mg/dL     Total Cholesterol: 218 mg/dL  A/P: Hypertension diagnosed currently above goal on current medications. BP goal < 130/80 mmHg. Medication adherence appears sub-optimal taking 1/2 tablet most days. When taking 10mg  dose, BP appears to be at goal.  -Continue amlodipine to 10 mg daily.  -Counseled on lifestyle modifications for blood pressure control including reduced dietary sodium, increased exercise, adequate sleep. -Encouraged patient to check BP at home and bring log of readings to next visit.  -Appropriate lifestyle habits to improve blood pressure. Continue limiting salt intake and regular exercise.  -Encouraged to follow up with eye doctor  -Offered resources for mobile unit if needed for acute visits  Results reviewed and written information provided. Patient verbalized understanding of treatment plan. Total time in face-to-face counseling 30 minutes.   F/u clinic visit in 1 month with PCP and 2 months with pharmacist.   Thank you for allowing pharmacy to participate in this patient's care.  08/04/2021, PharmD PGY1 Acute Care Resident  08/03/2021,2:49 PM

## 2021-08-03 NOTE — Telephone Encounter (Signed)
Referral submitted to ophthalmology. 

## 2021-08-04 NOTE — Telephone Encounter (Signed)
Called pt and made a tele phone visit for the 27th with [facility]

## 2021-08-09 ENCOUNTER — Ambulatory Visit: Payer: Medicare Other | Admitting: Critical Care Medicine

## 2021-08-09 VITALS — BP 128/77 | HR 88 | Temp 98.0°F | Resp 18 | Ht 62.0 in | Wt 138.0 lb

## 2021-08-09 DIAGNOSIS — M25551 Pain in right hip: Secondary | ICD-10-CM

## 2021-08-09 DIAGNOSIS — M545 Low back pain, unspecified: Secondary | ICD-10-CM

## 2021-08-09 MED ORDER — MELOXICAM 15 MG PO TABS: 15.0000 mg | ORAL_TABLET | Freq: Every day | ORAL | 0 refills | Status: DC

## 2021-08-09 NOTE — Progress Notes (Signed)
Acute Office Visit  Subjective:     Patient ID: Mandy Shelton, female    DOB: 09/05/1955, 66 y.o.   MRN: [id number]  Chief Complaint  Patient presents with   Pain    R Side    HPI Patient is in today for right-sided pain This is a primary care patient of Dr. Laural Benes.  Patient is noted several days worth of right-sided flank pain right hip pain and groin pain worse with leg movement.  She does have chronic left knee pain in the past with arthritis.  Patient went to the emergency room on the 14th concerned about potential gallbladder disease.  She left without being seen after 11 hours.  Work-up there was unrevealing.  Labs were unremarkable urinalysis was negative there was no gallbladder disease there was mild steatosis in the liver.  Patient denies any fever she does have difficulty with starting and stopping her stream with urination.  She has not been evaluated overactive bladder in the past.  Patient does have history of hypertension she maintains amlodipine 10 mg daily.  On arrival blood pressure is good 128/77.    Review of Systems  HENT: Negative.    Respiratory: Negative.    Cardiovascular: Negative.   Gastrointestinal:  Positive for abdominal pain.  Genitourinary:  Positive for flank pain, frequency and urgency. Negative for dysuria and hematuria.       Incont of urine  Musculoskeletal:  Positive for back pain.  Skin: Negative.   Neurological: Negative.   Psychiatric/Behavioral: Negative.          Objective:    BP 128/77 (BP Location: Left Arm, Patient Position: Sitting, Cuff Size: Normal)   Pulse 88   Temp 98 F (36.7 C)   Resp 18   Ht 5\' 2"  (1.575 m)   Wt 138 lb (62.6 kg)   SpO2 100%   BMI 25.24 kg/m    Physical Exam Vitals reviewed.  Constitutional:      Appearance: Normal appearance. She is well-developed. She is not diaphoretic.  HENT:     Head: Normocephalic and atraumatic.     Nose: No nasal deformity, septal deviation, mucosal edema or  rhinorrhea.     Right Sinus: No maxillary sinus tenderness or frontal sinus tenderness.     Left Sinus: No maxillary sinus tenderness or frontal sinus tenderness.     Mouth/Throat:     Pharynx: No oropharyngeal exudate.  Eyes:     General: No scleral icterus.    Conjunctiva/sclera: Conjunctivae normal.     Pupils: Pupils are equal, round, and reactive to light.  Neck:     Thyroid: No thyromegaly.     Vascular: No carotid bruit or JVD.     Trachea: Trachea normal. No tracheal tenderness or tracheal deviation.  Cardiovascular:     Rate and Rhythm: Normal rate and regular rhythm.     Chest Wall: PMI is not displaced.     Pulses: Normal pulses. No decreased pulses.     Heart sounds: Normal heart sounds, S1 normal and S2 normal. Heart sounds not distant. No murmur heard.    No systolic murmur is present.     No diastolic murmur is present.     No friction rub. No gallop. No S3 or S4 sounds.  Pulmonary:     Effort: No tachypnea, accessory muscle usage or respiratory distress.     Breath sounds: No stridor. No decreased breath sounds, wheezing, rhonchi or rales.  Chest:     Chest  wall: No tenderness.  Abdominal:     General: Bowel sounds are normal. There is no distension.     Palpations: Abdomen is soft. Abdomen is not rigid. There is no mass.     Tenderness: There is no abdominal tenderness. There is no guarding or rebound.     Hernia: No hernia is present.  Musculoskeletal:        General: Tenderness present. Normal range of motion.     Cervical back: Normal range of motion and neck supple. No edema, erythema or rigidity. No muscular tenderness. Normal range of motion.     Comments: Tender over the bursa of the right hip and also upper portion of the hip area at the top of the pelvis however the bladder is not tender and the upper abdominal area is unremarkable  Lymphadenopathy:     Head:     Right side of head: No submental or submandibular adenopathy.     Left side of head: No  submental or submandibular adenopathy.     Cervical: No cervical adenopathy.  Skin:    General: Skin is warm and dry.     Coloration: Skin is not pale.     Findings: No rash.     Nails: There is no clubbing.  Neurological:     Mental Status: She is alert and oriented to person, place, and time.     Sensory: No sensory deficit.  Psychiatric:        Mood and Affect: Mood normal.        Speech: Speech normal.        Behavior: Behavior normal.        Thought Content: Thought content normal.     No results found for any visits on 08/09/21.      Assessment & Plan:   Problem List Items Addressed This Visit   None   No orders of the defined types were placed in this encounter.   No follow-ups on file.  Shan Levans, MD

## 2021-08-09 NOTE — Patient Instructions (Signed)
Begin meloxicam 1 pill daily 30 you were given the sent to your [facility] for inflammation  X-rays of the right hip pelvis and lumbar spine will be obtained at [facility] you can go anytime tomorrow at your convenience there is no appointment needed   You might have an overactive bladder however I will defer to Dr. Laural Benes  Physical therapy consultation and sports medicine consultation may be options  I suspect this is bursitis of the right hip or this could be inflammation of the lumbar spine  Follow-up with Dr. Laural Benes on this problem in the next 3 to 4 weeks  There is medication for overactive bladder but I would prefer Dr. Laural Benes fully assess that  No additional labs are needed all labs were done in the emergency room and were normal including urinalysis  CT scan suggest this is not your gallbladder or kidneys

## 2021-08-09 NOTE — Assessment & Plan Note (Signed)
As per hip assessment  Plan to be to obtain images of the right hip and pelvis and lumbar spine  Patient will receive meloxicam 15 mg daily  We will see primary care provider short-term follow-up  No additional labs indicated

## 2021-08-09 NOTE — Assessment & Plan Note (Signed)
Pain is more consistent with musculoskeletal nature  I doubt gallbladder doubt renal urinalysis was negative  Probability of bursitis of the right hip is likely diagnosis need to rule out also lumbar strain

## 2021-08-10 ENCOUNTER — Ambulatory Visit (HOSPITAL_COMMUNITY)
Admission: RE | Admit: 2021-08-10 | Discharge: 2021-08-10 | Disposition: A | Discharge status: Home / Self Care | Payer: Medicare Other | Source: Ambulatory Visit | Attending: Critical Care Medicine | Admitting: Critical Care Medicine

## 2021-08-10 DIAGNOSIS — M545 Low back pain, unspecified: Secondary | ICD-10-CM | POA: Diagnosis not present

## 2021-08-10 DIAGNOSIS — M25551 Pain in right hip: Secondary | ICD-10-CM | POA: Diagnosis not present

## 2021-08-11 ENCOUNTER — Telehealth: Payer: Self-pay

## 2021-08-15 ENCOUNTER — Ambulatory Visit: Payer: Medicare Other | Attending: Internal Medicine | Admitting: Internal Medicine

## 2021-08-15 DIAGNOSIS — I700 Atherosclerosis of aorta: Secondary | ICD-10-CM

## 2021-08-15 DIAGNOSIS — E782 Mixed hyperlipidemia: Secondary | ICD-10-CM

## 2021-08-15 DIAGNOSIS — M79604 Pain in right leg: Secondary | ICD-10-CM | POA: Diagnosis not present

## 2021-08-16 ENCOUNTER — Ambulatory Visit
Admission: EM | Admit: 2021-08-16 | Discharge: 2021-08-16 | Disposition: A | Discharge status: Home / Self Care | Payer: Medicare Other | Attending: Urgent Care | Admitting: Urgent Care

## 2021-08-16 DIAGNOSIS — N3000 Acute cystitis without hematuria: Secondary | ICD-10-CM | POA: Insufficient documentation

## 2021-08-16 DIAGNOSIS — R102 Pelvic and perineal pain: Secondary | ICD-10-CM | POA: Diagnosis not present

## 2021-08-16 HISTORY — DX: Essential (primary) hypertension: I10

## 2021-08-16 LAB — POCT URINALYSIS DIP (MANUAL ENTRY)
Bilirubin, UA: NEGATIVE
Blood, UA: NEGATIVE
Glucose, UA: NEGATIVE mg/dL
Ketones, POC UA: NEGATIVE mg/dL
Leukocytes, UA: NEGATIVE
Nitrite, UA: POSITIVE — AB
Protein Ur, POC: NEGATIVE mg/dL
Spec Grav, UA: 1.015 (ref 1.010–1.025)
Urobilinogen, UA: 0.2 E.U./dL
pH, UA: 7 (ref 5.0–8.0)

## 2021-08-16 MED ORDER — CEPHALEXIN 500 MG PO CAPS
500.0000 mg | ORAL_CAPSULE | Freq: Two times a day (BID) | ORAL | 0 refills | Status: DC
Start: 2021-08-16 — End: 2021-08-31

## 2021-08-16 MED ORDER — FLUCONAZOLE 150 MG PO TABS
150.0000 mg | ORAL_TABLET | ORAL | 0 refills | Status: DC
Start: 2021-08-16 — End: 2021-08-31

## 2021-08-16 NOTE — Discharge Instructions (Addendum)

## 2021-08-16 NOTE — ED Provider Notes (Signed)
[facility]   MRN: [id number] DOB: January 01, 1956  Subjective:   Mandy Shelton is a 66 y.o. female presenting for 3 day history of acute onset urinary frequency, urinary urgency right-sided pelvic pain, right-sided abdominal pain. No CKD.  Patient has had multiple visits for the pain of her right hip.  Has had x-rays and CT scans done.  She had blood work done.  All the work-up has been negative.  She is requesting an ultrasound.  She does have a primary care provider that she is following up with.  Of note, patient was seen through the health department about 2 or 3 weeks ago.  States that she was tested for sexually transmitted infections and was negative.  They gave her Macrobid for 5 days for an urinary tract infection but could not confirm that this is in fact what she had.  No current facility-administered medications for this encounter.  Current Outpatient Medications:    amLODipine (NORVASC) 10 MG tablet, Take 1 tablet (10 mg total) by mouth daily., Disp: 90 tablet, Rfl: 1   Calcium Carb-Cholecalciferol 600-20 MG-MCG TABS, calcium carbonate-vitamin D3 600 mg (1,500 mg)-800 unit tablet  Take 1 tablet twice a day by oral route for 30 days., Disp: , Rfl:    fluconazole (DIFLUCAN) 150 MG tablet, Take 1 tablet (150 mg total) by mouth daily. (Patient not taking: Reported on 08/09/2021), Disp: 1 tablet, Rfl: 0   meloxicam (MOBIC) 15 MG tablet, Take 1 tablet (15 mg total) by mouth daily., Disp: 30 tablet, Rfl: 0   Multiple Vitamins-Minerals (ONE-A-DAY FOR HER VITACRAVES PO), Take 1 tablet by mouth once., Disp: , Rfl:    Allergies  Allergen Reactions   Sulfa Antibiotics    Past Medical History:  Diagnosis Date   Hypertension      History reviewed. No pertinent surgical history.  Family History  Problem Relation Age of Onset   Thyroid disease Mother    Breast cancer Neg Hx     Social History   Tobacco Use   Smoking status: Never   Smokeless tobacco: Never   Substance Use Topics   Alcohol use: Yes    Comment: occasionally   Drug use: Never    ROS   Objective:   Vitals: BP (!) 157/83 (BP Location: Left Arm)   Pulse 83   Temp 98.7 F (37.1 C) (Oral)   Resp 16   SpO2 98%   Physical Exam Constitutional:      General: She is not in acute distress.    Appearance: Normal appearance. She is well-developed. She is not ill-appearing, toxic-appearing or diaphoretic.  HENT:     Head: Normocephalic and atraumatic.     Nose: Nose normal.     Mouth/Throat:     Mouth: Mucous membranes are moist.     Pharynx: Oropharynx is clear.  Eyes:     General: No scleral icterus.       Right eye: No discharge.        Left eye: No discharge.     Extraocular Movements: Extraocular movements intact.     Conjunctiva/sclera: Conjunctivae normal.  Cardiovascular:     Rate and Rhythm: Normal rate.  Pulmonary:     Effort: Pulmonary effort is normal.  Abdominal:     General: Bowel sounds are normal. There is no distension.     Palpations: Abdomen is soft. There is no mass.     Tenderness: There is abdominal tenderness (right pelvic side) in the right  lower quadrant and suprapubic area. There is no right CVA tenderness, left CVA tenderness, guarding or rebound.  Skin:    General: Skin is warm and dry.  Neurological:     General: No focal deficit present.     Mental Status: She is alert and oriented to person, place, and time.  Psychiatric:        Mood and Affect: Mood normal.        Behavior: Behavior normal.        Thought Content: Thought content normal.        Judgment: Judgment normal.     Results for orders placed or performed during the hospital encounter of 08/16/21 (from the past 24 hour(s))  POCT urinalysis dipstick     Status: Abnormal   Collection Time: 08/16/21  4:18 PM  Result Value Ref Range   Color, UA light yellow (A) yellow   Clarity, UA clear clear   Glucose, UA negative negative mg/dL   Bilirubin, UA negative negative    Ketones, POC UA negative negative mg/dL   Spec Grav, UA 7.654 6.503 - 1.025   Blood, UA negative negative   pH, UA 7.0 5.0 - 8.0   Protein Ur, POC negative negative mg/dL   Urobilinogen, UA 0.2 0.2 or 1.0 E.U./dL   Nitrite, UA Positive (A) Negative   Leukocytes, UA Negative Negative    Assessment and Plan :   PDMP not reviewed this encounter.  1. Acute cystitis without hematuria   2. Pelvic pain     Start Keflex to cover for acute cystitis, urine culture pending.  Recommended aggressive hydration, limiting urinary irritants.  Unfortunately I cannot do more work-up than what has already been done with her PCP regarding her pelvic pain.  Recommended they recheck with them for consideration of more testing.  Counseled patient on potential for adverse effects with medications prescribed/recommended today, ER and return-to-clinic precautions discussed, patient verbalized understanding.    Wallis Bamberg, [place] 08/16/21 1649

## 2021-08-16 NOTE — ED Triage Notes (Signed)
Worsening abd./growing pain; RLQ. Increased urinary frequency x3 weeks; right hip pain is ongoing.

## 2021-08-18 LAB — URINE CULTURE: Culture: >=100000 — AB

## 2021-08-31 ENCOUNTER — Ambulatory Visit: Payer: Medicare Other | Attending: Internal Medicine | Admitting: Internal Medicine

## 2021-08-31 ENCOUNTER — Encounter: Payer: Self-pay | Admitting: Internal Medicine

## 2021-08-31 VITALS — BP 130/78 | HR 73 | Temp 98.8°F | Ht 62.0 in | Wt 138.2 lb

## 2021-08-31 DIAGNOSIS — Z780 Asymptomatic menopausal state: Secondary | ICD-10-CM | POA: Diagnosis not present

## 2021-08-31 DIAGNOSIS — I10 Essential (primary) hypertension: Secondary | ICD-10-CM

## 2021-08-31 DIAGNOSIS — M545 Low back pain, unspecified: Secondary | ICD-10-CM | POA: Diagnosis not present

## 2021-08-31 DIAGNOSIS — N3941 Urge incontinence: Secondary | ICD-10-CM | POA: Diagnosis not present

## 2021-08-31 MED ORDER — SOLIFENACIN SUCCINATE 5 MG PO TABS: 5.0000 mg | ORAL_TABLET | Freq: Every day | ORAL | 1 refills | Status: DC

## 2021-08-31 NOTE — Patient Instructions (Signed)
I recommend using IcyHot rub or Voltaren gel to the lower back. Use a heating pad as needed.

## 2021-08-31 NOTE — Progress Notes (Signed)
Discuss recent test results.

## 2021-08-31 NOTE — Progress Notes (Signed)
Patient ID: Mandy Shelton, female    DOB: 1955-05-25  MRN: [id number]  CC: Hypertension   Subjective: Mandy Shelton is a 66 y.o. female who presents for f/u Her concerns today include:  Pt with overwgh, liver cyst, HTN.   Seen at [facility] since last visit with urinary frequency and suprapubic.  Patient states the pain was so bad she had difficulty flexing the leg at the right hip.  Reports being seen at the health department several days prior and was prescribed antibiotics for UTI but was not getting better.  UA at urgent care was positive for nitrates.  Culture was grew E. coli resistant to Cipro and nitrofurantoin.  Patient was treated with Keflex.  She has completed the course of the antibiotics.  Symptoms have since resolved..  Complains of issues with urinary incontinence.  Over 3 months.  When she gets the urge to urinate she has to go right away.  She has started wearing pads.  Denies any incontinence with coughing or laughing.  She has been doing Kegel's exercises  Still has pain RT side that comes and goes but not every day x 3 mth .  She points to the wing of the iliac bone and the right upper lumbar region.  No radiation down the leg.  No numbness or tingling in the leg. Nagging pain Nothing causes it to flare Heating pad helps  some.   No fever, N/V.   HTN:  taking Norvasc and limits salt.  Supposed to be on 10 mg daily but states she has only been taking half a tablet.  Checks her blood pressure daily.  Some of her readings have been 106/65, 130/70.  When she initially started taking the medicine she felt it was messing with her vision because she would sometimes have problems reading things close up..  She has scheduled an appointment with the eye doctor but it is not until October.  HM:  Declines vaccines for shingles and Pneumovax today.  She is due for DEXA scan for osteoporosis screening.  She is agreeable to having that done.  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 90 tablet 1   Calcium Carb-Cholecalciferol 600-20 MG-MCG TABS calcium carbonate-vitamin D3 600 mg (1,500 mg)-800 unit tablet  Take 1 tablet twice a day by oral route for 30 days.     Multiple Vitamins-Minerals (ONE-A-DAY FOR HER VITACRAVES PO) Take 1 tablet by mouth once.     meloxicam (MOBIC) 15 MG tablet Take 1 tablet (15 mg total) by mouth daily. (Patient not taking: Reported on 08/31/2021) 30 tablet 0   No current facility-administered medications on file prior to visit.    Allergies  Allergen Reactions   Sulfa Antibiotics     Social History   Socioeconomic History   Marital status: Single    Spouse name: Not on file   Number of children: 2   Years of education: Not on file   Highest education level: Bachelor's degree (e.g., BA, AB, BS)  Occupational History  Not on file  Tobacco Use   Smoking status: Never   Smokeless tobacco: Never  Vaping Use   Vaping Use: Not on file  Substance and Sexual Activity   Alcohol use: Yes    Comment: occasionally   Drug use: Never   Sexual activity: Yes    Birth control/protection: Post-menopausal, None  Other Topics Concern   Not on file  Social History Narrative   Not on file   Social Determinants of Health   Financial Resource Strain: Not on file  Food Insecurity: Not on file  Transportation Needs: No Transportation Needs (06/09/2019)   PRAPARE - Transportation    Lack of Transportation (Medical): No    Lack of Transportation (Non-Medical): No  Physical Activity: Not on  file  Stress: Not on file  Social Connections: Not on file  Intimate Partner Violence: Not on file    Family History  Problem Relation Age of Onset   Thyroid disease Mother    Breast cancer Neg Hx     No past surgical history on file.  ROS: Review of Systems Negative except as stated above  PHYSICAL EXAM: BP 130/78   Pulse 73   Temp 98.8 F (37.1 C) (Oral)   Ht 5\' 2"  (1.575 m)   Wt 138 lb 3.2 oz (62.7 kg)   SpO2 99%   BMI 25.28 kg/m   Physical Exam  General appearance - alert, well appearing, and in no distress Mental status - normal mood, behavior, speech, dress, motor activity, and thought processes Chest - clear to auscultation, no wheezes, rales or rhonchi, symmetric air entry Heart - normal rate, regular rhythm, normal S1, S2, no murmurs, rubs, clicks or gallops Abdomen: Normal bowel sounds, nondistended, nontender.  No organomegaly. Musculoskeletal -slight tenderness on palpation of the right upper lumbar paraspinal muscles.  No tenderness on palpation of the lumbar spine. Extremities -no lower extremity edema.      Latest Ref Rng & Units 08/02/2021    4:54 PM 11/10/2020    3:33 PM  CMP  Glucose 70 - 99 mg/dL 90  94   BUN 8 - 23 mg/dL 9  12   Creatinine 11/12/2020 - 1.00 mg/dL 1.61  0.96   Sodium 0.45 - 145 mmol/L 137  138   Potassium 3.5 - 5.1 mmol/L 4.0  4.9   Chloride 98 - 111 mmol/L 103  99   CO2 22 - 32 mmol/L 24  25   Calcium 8.9 - 10.3 mg/dL 9.3  9.9   Total Protein 6.5 - 8.1 g/dL 7.7  8.1   Total Bilirubin 0.3 - 1.2 mg/dL 0.8  0.3   Alkaline Phos 38 - 126 U/L 48  65   AST 15 - 41 U/L 19  26   ALT 0 - 44 U/L 16  15    Lipid Panel     Component Value Date/Time   CHOL 218 (H) 11/10/2020 1533   TRIG 99 11/10/2020 1533   HDL 69 11/10/2020 1533   CHOLHDL 3.2 11/10/2020 1533   LDLCALC 132 (H) 11/10/2020 1533    CBC    Component Value Date/Time   WBC 6.6 08/02/2021 1654   RBC 4.36 08/02/2021 1654   HGB 12.8 08/02/2021 1654   HGB 13.0 11/10/2020  1533   HCT 39.4 08/02/2021 1654   HCT 39.0 11/10/2020 1533   PLT 222 08/02/2021 1654   PLT 268 11/10/2020 1533   MCV 90.4 08/02/2021 1654   MCV 89 11/10/2020 1533   MCH 29.4  08/02/2021 1654   MCHC 32.5 08/02/2021 1654   RDW 12.3 08/02/2021 1654   RDW 12.5 11/10/2020 1533   LYMPHSABS 3.2 08/02/2021 1654   MONOABS 0.5 08/02/2021 1654   EOSABS 0.2 08/02/2021 1654   BASOSABS 0.1 08/02/2021 1654    ASSESSMENT AND PLAN:  1. Essential hypertension At goal. Given the option of stopping the amlodipine and changing to another antihypertensive if she feels it is affecting her vision.  Patient opted not to at this time.  Encouraged her to keep the appointment with the eye doctor later this year. I told her it is okay to continue the amlodipine at 10 mg half a tablet daily.  2. Urge incontinence Recommend trial of oral medication to help decrease symptoms.  She is agreeable to this.  We will try her with low-dose of Vesicare. - solifenacin (VESICARE) 5 MG tablet; Take 1 tablet (5 mg total) by mouth daily.  Dispense: 30 tablet; Refill: 1  3. Acute right-sided low back pain without sciatica Seems MSK in nature and nonradicular.  Recommend trying Voltaren gel or IcyHot rub over-the-counter.  Advised that she can also use a heating pad at night.  4. Postmenopausal estrogen deficiency - DG Bone Density; Future    Patient was given the opportunity to ask questions.  Patient verbalized understanding of the plan and was able to repeat key elements of the plan.   This documentation was completed using Paediatric nurse.  Any transcriptional errors are unintentional.  Orders Placed This Encounter  Procedures   DG Bone Density     Requested Prescriptions   Signed Prescriptions Disp Refills   solifenacin (VESICARE) 5 MG tablet 30 tablet 1    Sig: Take 1 tablet (5 mg total) by mouth daily.    Return in about 6 weeks (around 10/12/2021) for for Medicare Wellness  Visit.  Jonah Blue, MD, FACP

## 2021-09-14 ENCOUNTER — Ambulatory Visit
Admission: RE | Admit: 2021-09-14 | Discharge: 2021-09-14 | Disposition: A | Discharge status: Home / Self Care | Payer: Medicare Other | Source: Ambulatory Visit | Attending: Internal Medicine | Admitting: Internal Medicine

## 2021-09-14 ENCOUNTER — Other Ambulatory Visit: Payer: Self-pay | Admitting: Internal Medicine

## 2021-09-14 DIAGNOSIS — M810 Age-related osteoporosis without current pathological fracture: Secondary | ICD-10-CM | POA: Diagnosis not present

## 2021-09-14 DIAGNOSIS — Z780 Asymptomatic menopausal state: Secondary | ICD-10-CM

## 2021-09-14 DIAGNOSIS — M85851 Other specified disorders of bone density and structure, right thigh: Secondary | ICD-10-CM | POA: Diagnosis not present

## 2021-09-14 MED ORDER — ALENDRONATE SODIUM 70 MG PO TABS: 70.0000 mg | ORAL_TABLET | ORAL | 11 refills | Status: DC

## 2021-09-17 ENCOUNTER — Telehealth: Payer: Self-pay | Admitting: Internal Medicine

## 2021-09-17 NOTE — Telephone Encounter (Signed)
Phone call placed to patient today to go over the results of her bone density study.  I had already sent the patient her results via MyChart and I see that she did review my comments and recommendation to start Fosamax along with calcium plus vitamin D. Patient confirms that she reviewed my comments and did fill the prescription for the Fosamax.  She plans to start taking it once a week next week.  I reiterated the importance of taking the medicine first thing in the morning and waiting 30 minutes to 1 hour before taking any of her other medications or eating.  Advised to sit upright for 30 minutes to 1 hour after taking.  Patient expressed understanding.  She has Os-Cal plus vitamin D 600/400 IU.  I told her to take 1 tablet twice a day.  Discussed the importance of regular weightbearing exercise like brisk walking.

## 2021-09-21 ENCOUNTER — Ambulatory Visit: Payer: Medicare Other | Admitting: Pharmacist

## 2021-10-26 ENCOUNTER — Ambulatory Visit: Payer: Medicare Other | Admitting: Pharmacist

## 2021-11-22 DIAGNOSIS — H52203 Unspecified astigmatism, bilateral: Secondary | ICD-10-CM | POA: Diagnosis not present

## 2021-11-30 ENCOUNTER — Encounter: Payer: Medicare Other | Admitting: Internal Medicine

## 2022-02-02 ENCOUNTER — Other Ambulatory Visit: Payer: Self-pay | Admitting: Internal Medicine

## 2022-02-06 ENCOUNTER — Other Ambulatory Visit: Payer: Self-pay | Admitting: Internal Medicine

## 2022-02-06 DIAGNOSIS — Z1231 Encounter for screening mammogram for malignant neoplasm of breast: Secondary | ICD-10-CM

## 2022-02-28 ENCOUNTER — Other Ambulatory Visit: Payer: Self-pay | Admitting: Internal Medicine

## 2022-02-28 NOTE — Telephone Encounter (Signed)
Requested Prescriptions  Refused Prescriptions Disp Refills   amLODipine (NORVASC) 10 MG tablet [Pharmacy Med Name: amLODIPine Besylate 10 MG Oral Tablet] 30 tablet 0    Sig: Take 1 tablet by mouth once daily     Cardiovascular: Calcium Channel Blockers 2 Failed - 02/28/2022  6:51 AM      Failed - Valid encounter within last 6 months    Recent Outpatient Visits           6 months ago Essential hypertension   [place] Ladell Pier, MD   6 months ago Pain of right lower extremity   [place], Deborah B, MD   6 months ago Essential hypertension   [place], Jarome Matin, RPH-CPP   9 months ago Essential hypertension   [place], Jarome Matin, RPH-CPP   10 months ago Pap smear for cervical cancer screening   [place] Ladell Pier, MD              Passed - Last BP in normal range    BP Readings from Last 1 Encounters:  08/31/21 130/78         Passed - Last Heart Rate in normal range    Pulse Readings from Last 1 Encounters:  08/31/21 73

## 2022-02-28 NOTE — Telephone Encounter (Signed)
Patient called, left VM to return the call to the office to scheduled an appt for medication refill request.   

## 2022-03-30 ENCOUNTER — Ambulatory Visit
Admission: RE | Admit: 2022-03-30 | Discharge: 2022-03-30 | Disposition: A | Discharge status: Home / Self Care | Payer: Medicare Other | Source: Ambulatory Visit | Attending: Internal Medicine | Admitting: Internal Medicine

## 2022-03-30 ENCOUNTER — Ambulatory Visit: Payer: Medicare Other

## 2022-03-30 DIAGNOSIS — Z1231 Encounter for screening mammogram for malignant neoplasm of breast: Secondary | ICD-10-CM | POA: Diagnosis not present

## 2022-04-04 ENCOUNTER — Other Ambulatory Visit: Payer: Self-pay | Admitting: Internal Medicine

## 2022-04-04 DIAGNOSIS — R928 Other abnormal and inconclusive findings on diagnostic imaging of breast: Secondary | ICD-10-CM

## 2022-04-13 ENCOUNTER — Ambulatory Visit
Admission: RE | Admit: 2022-04-13 | Discharge: 2022-04-13 | Disposition: A | Discharge status: Home / Self Care | Payer: Medicare Other | Source: Ambulatory Visit | Attending: Internal Medicine | Admitting: Internal Medicine

## 2022-04-13 DIAGNOSIS — N6489 Other specified disorders of breast: Secondary | ICD-10-CM | POA: Diagnosis not present

## 2022-04-13 DIAGNOSIS — N644 Mastodynia: Secondary | ICD-10-CM | POA: Diagnosis not present

## 2022-04-13 DIAGNOSIS — R928 Other abnormal and inconclusive findings on diagnostic imaging of breast: Secondary | ICD-10-CM | POA: Diagnosis not present

## 2022-05-17 ENCOUNTER — Encounter: Payer: Self-pay | Admitting: Physician Assistant

## 2022-05-17 ENCOUNTER — Ambulatory Visit: Payer: Medicare Other | Attending: Physician Assistant | Admitting: Physician Assistant

## 2022-05-17 VITALS — BP 133/77 | HR 70 | Wt 142.0 lb

## 2022-05-17 DIAGNOSIS — E782 Mixed hyperlipidemia: Secondary | ICD-10-CM

## 2022-05-17 DIAGNOSIS — I10 Essential (primary) hypertension: Secondary | ICD-10-CM

## 2022-05-17 MED ORDER — AMLODIPINE BESYLATE 10 MG PO TABS: 10.0000 mg | ORAL_TABLET | Freq: Every day | ORAL | 1 refills | Status: DC

## 2022-05-17 NOTE — Progress Notes (Signed)
Patient ID: Mandy Shelton, female   DOB: October 27, 1955, 67 y.o.   MRN: [id number]   Mandy Shelton, is a 67 y.o. female  [id number]  [id number]  DOB - Aug 28, 1955  Chief Complaint  Patient presents with   Medication Refill       Subjective:   Mandy Shelton is a 67 y.o. female here today for BP med RF.  Says she never started fosamax that Dr Wynetta Emery recommended.  BP at home usu 120s/70s.  No CP/SOB/dizziness/HA.  She has only eaten some vegetables today.    No problems updated.  ALLERGIES: Allergies  Allergen Reactions   Sulfa Antibiotics     PAST MEDICAL HISTORY: Past Medical History:  Diagnosis Date   Hypertension     MEDICATIONS AT HOME: Prior to Admission medications   Medication Sig Start Date End Date Taking? Authorizing Provider  alendronate (FOSAMAX) 70 MG tablet Take 1 tablet (70 mg total) by mouth every 7 (seven) days. Take with a full glass of water on an empty stomach and sit upright for 30 minutes after taking 09/14/21   Ladell Pier, MD  amLODipine (NORVASC) 10 MG tablet Take 1 tablet (10 mg total) by mouth daily. 05/17/22   Argentina Donovan, PA-C  Calcium Carb-Cholecalciferol 600-20 MG-MCG TABS calcium carbonate-vitamin D3 600 mg (1,500 mg)-800 unit tablet  Take 1 tablet twice a day by oral route for 30 days. Patient not taking: Reported on 05/17/2022    [provider]  Multiple Vitamins-Minerals (ONE-A-DAY FOR HER VITACRAVES PO) Take 1 tablet by mouth once. Patient not taking: Reported on 05/17/2022    [provider]  solifenacin (VESICARE) 5 MG tablet Take 1 tablet (5 mg total) by mouth daily. 08/31/21   Ladell Pier, MD    ROS: Neg HEENT Neg resp Neg cardiac Neg GI Neg GU Neg MS Neg psych Neg neuro  Objective:   Vitals:   05/17/22 1559  BP: 133/77  Pulse: 70  SpO2: 99%  Weight: 142 lb (64.4 kg)   Exam General appearance : Awake, alert, not in any distress. Speech Clear. Not toxic looking HEENT: Atraumatic  and Normocephalic Neck: Supple, no JVD. No cervical lymphadenopathy.  Chest: Good air entry bilaterally, CTAB.  No rales/rhonchi/wheezing CVS: S1 S2 regular, no murmurs.  Extremities: B/L Lower Ext shows no edema, both legs are warm to touch Neurology: Awake alert, and oriented X 3, CN II-XII intact, Non focal Skin: No Rash  Data Review No results found for: "HGBA1C"  Assessment & Plan   1. Mixed hyperlipidemia Previously elevated - Lipid panel  2. Essential hypertension controlled - amLODipine (NORVASC) 10 MG tablet; Take 1 tablet (10 mg total) by mouth daily.  Dispense: 90 tablet; Refill: 1 - Basic metabolic panel    Return in about 6 months (around 11/17/2022) for PCP-Dr Wynetta Emery chronic conditions.  The patient was given clear instructions to go to ER or return to medical center if symptoms don't improve, worsen or new problems develop. The patient verbalized understanding. The patient was told to call to get lab results if they haven't heard anything in the next week.      Freeman Caldron, PA-C [facility] [place], [place]   05/17/2022, 5:44 PM

## 2022-05-18 LAB — BASIC METABOLIC PANEL
BUN/Creatinine Ratio: 11 — ABNORMAL LOW (ref 12–28)
BUN: 11 mg/dL (ref 8–27)
CO2: 27 mmol/L (ref 20–29)
Calcium: 10 mg/dL (ref 8.7–10.3)
Chloride: 100 mmol/L (ref 96–106)
Creatinine, Ser: 1.04 mg/dL — ABNORMAL HIGH (ref 0.57–1.00)
Glucose: 90 mg/dL (ref 70–99)
Potassium: 4.6 mmol/L (ref 3.5–5.2)
Sodium: 140 mmol/L (ref 134–144)
eGFR: 60 mL/min/{1.73_m2} (ref >59)

## 2022-05-18 LAB — LIPID PANEL
Chol/HDL Ratio: 3.1 ratio (ref 0.0–4.4)
Cholesterol, Total: 227 mg/dL — ABNORMAL HIGH (ref 100–199)
HDL: 74 mg/dL (ref >39)
LDL Chol Calc (NIH): 137 mg/dL — ABNORMAL HIGH (ref 0–99)
Triglycerides: 93 mg/dL (ref 0–149)
VLDL Cholesterol Cal: 16 mg/dL (ref 5–40)

## 2022-05-20 ENCOUNTER — Other Ambulatory Visit: Payer: Self-pay | Admitting: Physician Assistant

## 2022-05-20 MED ORDER — ATORVASTATIN CALCIUM 10 MG PO TABS: 10.0000 mg | ORAL_TABLET | Freq: Every day | ORAL | 3 refills | Status: DC

## 2022-06-15 ENCOUNTER — Encounter: Payer: Medicare Other | Admitting: Internal Medicine

## 2022-07-02 ENCOUNTER — Telehealth: Payer: Self-pay | Admitting: Internal Medicine

## 2022-07-02 NOTE — Telephone Encounter (Signed)
Contacted Mandy Shelton to schedule their annual wellness visit. Appointment made for 07/03/2022.  Thank you,  [facility] Support [facility] Direct dial  [phone]

## 2022-07-03 ENCOUNTER — Ambulatory Visit: Payer: Medicare Other | Attending: Internal Medicine

## 2022-07-03 VITALS — Ht 62.0 in | Wt 142.0 lb

## 2022-07-03 DIAGNOSIS — Z0000 Encounter for general adult medical examination without abnormal findings: Secondary | ICD-10-CM

## 2022-07-03 NOTE — Progress Notes (Signed)
Subjective:   Mandy Shelton is a 67 y.o. female who presents for an Initial Medicare Annual Wellness Visit.  I connected with  Mandy Shelton on 07/03/22 by a audio enabled telemedicine application and verified that I am speaking with the correct person using two identifiers.  Patient Location: Home  Provider Location: Home Office  I discussed the limitations of evaluation and management by telemedicine. The patient expressed understanding and agreed to proceed.  Review of Systems     Cardiac Risk Factors include: advanced age (>17men, >50 women);hypertension     Objective:    Today's Vitals   07/03/22 1902  Weight: 142 lb (64.4 kg)  Height: 5\' 2"  (1.575 m)   Body mass index is 25.97 kg/m.     07/03/2022    7:06 PM  Advanced Directives  Does Patient Have a Medical Advance Directive? No  Would patient like information on creating a medical advance directive? Yes (MAU/Ambulatory/Procedural Areas - Information given)    Current Medications (verified) Outpatient Encounter Medications as of 07/03/2022  Medication Sig   amLODipine (NORVASC) 10 MG tablet Take 1 tablet (10 mg total) by mouth daily.   Calcium Carb-Cholecalciferol 600-20 MG-MCG TABS    Multiple Vitamins-Minerals (ONE-A-DAY FOR HER VITACRAVES PO) Take 1 tablet by mouth once.   atorvastatin (LIPITOR) 10 MG tablet Take 1 tablet (10 mg total) by mouth daily. (Patient not taking: Reported on 07/03/2022)   [DISCONTINUED] alendronate (FOSAMAX) 70 MG tablet Take 1 tablet (70 mg total) by mouth every 7 (seven) days. Take with a full glass of water on an empty stomach and sit upright for 30 minutes after taking   [DISCONTINUED] solifenacin (VESICARE) 5 MG tablet Take 1 tablet (5 mg total) by mouth daily.   No facility-administered encounter medications on file as of 07/03/2022.    Allergies (verified) Sulfa antibiotics   History: Past Medical History:  Diagnosis Date   Hypertension    History reviewed. No  pertinent surgical history. Family History  Problem Relation Age of Onset   Thyroid disease Mother    Breast cancer Neg Hx    Social History   Socioeconomic History   Marital status: Single    Spouse name: Not on file   Number of children: 2   Years of education: Not on file   Highest education level: Bachelor's degree (e.g., BA, AB, BS)  Occupational History   Not on file  Tobacco Use   Smoking status: Never   Smokeless tobacco: Never  Vaping Use   Vaping Use: Not on file  Substance and Sexual Activity   Alcohol use: Yes    Alcohol/week: 3.0 standard drinks of alcohol    Types: 1 Glasses of wine, 2 Shots of liquor per week    Comment: occasionally   Drug use: Never   Sexual activity: Yes    Birth control/protection: Post-menopausal, None  Other Topics Concern   Not on file  Social History Narrative   Not on file   Social Determinants of Health   Financial Resource Strain: Low Risk  (07/03/2022)   Overall Financial Resource Strain (CARDIA)    Difficulty of Paying Living Expenses: Not hard at all  Food Insecurity: No Food Insecurity (07/03/2022)   Hunger Vital Sign    Worried About Running Out of Food in the Last Year: Never true    Ran Out of Food in the Last Year: Never true  Transportation Needs: No Transportation Needs (07/03/2022)   PRAPARE - Transportation  Lack of Transportation (Medical): No    Lack of Transportation (Non-Medical): No  Physical Activity: Insufficiently Active (07/03/2022)   Exercise Vital Sign    Days of Exercise per Week: 3 days    Minutes of Exercise per Session: 30 min  Stress: No Stress Concern Present (07/03/2022)   [employer] of Occupational Health - Occupational Stress Questionnaire    Feeling of Stress : Not at all  Social Connections: Moderately Isolated (07/03/2022)   Social Connection and Isolation Panel [NHANES]    Frequency of Communication with Friends and Family: More than three times a week    Frequency of Social  Gatherings with Friends and Family: Three times a week    Attends Religious Services: 1 to 4 times per year    Active Member of Clubs or Organizations: No    Attends Banker Meetings: Never    Marital Status: Never married    Tobacco Counseling Counseling given: Not Answered   Clinical Intake:  Pre-visit preparation completed: Yes  Pain : No/denies pain  Diabetes: No  How often do you need to have someone help you when you read instructions, pamphlets, or other written materials from your doctor or pharmacy?: 1 - Never  Diabetic?No   Interpreter Needed?: No  Information entered by :: Kandis Fantasia LPN   Activities of Daily Living    07/03/2022    7:06 PM 11/27/2021    5:26 PM  In your present state of health, do you have any difficulty performing the following activities:  Hearing? 0 0  Vision? 0 0  Difficulty concentrating or making decisions? 0 0  Walking or climbing stairs? 0 0  Dressing or bathing? 0 0  Doing errands, shopping? 0 0  Preparing Food and eating ? N N  Using the Toilet? N N  In the past six months, have you accidently leaked urine? N N  Do you have problems with loss of bowel control? N N  Managing your Medications? N N  Managing your Finances? N N  Housekeeping or managing your Housekeeping? N N    Patient Care Team: Marcine Matar, MD as PCP - General (Internal Medicine)  Indicate any recent Medical Services you may have received from other than [facility] providers in the past year (date may be approximate).     Assessment:   This is a routine wellness examination for Mandy Shelton.  Hearing/Vision screen Hearing Screening - Comments:: Denies hearing difficulties   Vision Screening - Comments:: Wears rx glasses - up to date with routine eye exams    Dietary issues and exercise activities discussed: Current Exercise Habits: Home exercise routine, Type of exercise: walking, Time (Minutes): 30, Frequency (Times/Week): 3, Weekly  Exercise (Minutes/Week): 90, Intensity: Mild   Goals Addressed             This Visit's Progress    Remain active and independent        Depression Screen    07/03/2022    7:05 PM 05/17/2022    4:00 PM 08/31/2021    2:06 PM 04/27/2021    2:18 PM 11/10/2020    2:33 PM  PHQ 2/9 Scores  PHQ - 2 Score 0 0 0 0 0  PHQ- 9 Score   0      Fall Risk    07/03/2022    7:06 PM 05/17/2022    4:00 PM 11/27/2021    5:26 PM 08/31/2021    2:06 PM 04/27/2021    2:18 PM  Fall  Risk   Falls in the past year? 0 0 0 0 0  Number falls in past yr: 0 0 0 0 0  Injury with Fall? 0  0 0 0  Risk for fall due to : No Fall Risks No Fall Risks     Follow up Falls prevention discussed;Education provided;Falls evaluation completed    Falls evaluation completed    FALL RISK PREVENTION PERTAINING TO THE HOME:  Any stairs in or around the home? No  If so, are there any without handrails? No  Home free of loose throw rugs in walkways, pet beds, electrical cords, etc? Yes  Adequate lighting in your home to reduce risk of falls? Yes   ASSISTIVE DEVICES UTILIZED TO PREVENT FALLS:  Life alert? No  Use of a cane, walker or w/c? No  Grab bars in the bathroom? Yes  Shower chair or bench in shower? No  Elevated toilet seat or a handicapped toilet? Yes   TIMED UP AND GO:  Was the test performed? No . Telephonic visit   Cognitive Function:        07/03/2022    7:06 PM  6CIT Screen  What Year? 0 points  What month? 0 points  What time? 0 points  Count back from 20 0 points  Months in reverse 0 points  Repeat phrase 0 points  Total Score 0 points    Immunizations Immunization History  Administered Date(s) Administered   Tdap 11/10/2020    TDAP status: Up to date  Flu Vaccine status: Up to date  Pneumococcal vaccine status: Due, Education has been provided regarding the importance of this vaccine. Advised may receive this vaccine at local pharmacy or Health Dept. Aware to provide a copy of the  vaccination record if obtained from local pharmacy or Health Dept. Verbalized acceptance and understanding.  Covid-19 vaccine status: Information provided on how to obtain vaccines.   Qualifies for Shingles Vaccine? Yes   Zostavax completed No   Shingrix Completed?: No.    Education has been provided regarding the importance of this vaccine. Patient has been advised to call insurance company to determine out of pocket expense if they have not yet received this vaccine. Advised may also receive vaccine at local pharmacy or Health Dept. Verbalized acceptance and understanding.  Screening Tests Health Maintenance  Topic Date Due   COVID-19 Vaccine (1) Never done   Zoster Vaccines- Shingrix (1 of 2) Never done   COLONOSCOPY (Pts 45-58yrs Insurance coverage will need to be confirmed)  06/20/2022   Pneumonia Vaccine 72+ Years old (1 of 1 - PCV) 09/01/2022 (Originally 07/10/2021)   INFLUENZA VACCINE  09/20/2022   Medicare Annual Wellness (AWV)  07/03/2023   MAMMOGRAM  03/30/2024   PAP SMEAR-Modifier  04/27/2024   DTaP/Tdap/Td (2 - Td or Tdap) 11/11/2030   DEXA SCAN  Completed   Hepatitis C Screening  Completed   HIV Screening  Completed   HPV VACCINES  Aged Out    Health Maintenance  Health Maintenance Due  Topic Date Due   COVID-19 Vaccine (1) Never done   Zoster Vaccines- Shingrix (1 of 2) Never done   COLONOSCOPY (Pts 45-3yrs Insurance coverage will need to be confirmed)  06/20/2022    Colorectal cancer screening: Type of screening: Colonoscopy. Completed 06/19/17. Repeat every 5 years  Mammogram status: Completed 03/30/22. Repeat every year  Bone Density status: Completed 09/14/21. Results reflect: Bone density results: OSTEOPOROSIS. Repeat every 2 years.  Lung Cancer Screening: (Low Dose CT Chest recommended  if Age 55-80 years, 30 pack-year currently smoking OR have quit w/in 15years.) does not qualify.   Lung Cancer Screening Referral: n/a  Additional Screening:  Hepatitis C  Screening: does qualify; Completed 11/10/20  Vision Screening: Recommended annual ophthalmology exams for early detection of glaucoma and other disorders of the eye. Is the patient up to date with their annual eye exam?  Yes  Who is the provider or what is the name of the office in which the patient attends annual eye exams? Unable to provide name  If pt is not established with a provider, would they like to be referred to a provider to establish care? No .   Dental Screening: Recommended annual dental exams for proper oral hygiene  Community Resource Referral / Chronic Care Management: CRR required this visit?  No   CCM required this visit?  No      Plan:     I have personally reviewed and noted the following in the patient's chart:   Medical and social history Use of alcohol, tobacco or illicit drugs  Current medications and supplements including opioid prescriptions. Patient is not currently taking opioid prescriptions. Functional ability and status Nutritional status Physical activity Advanced directives List of other physicians Hospitalizations, surgeries, and ER visits in previous 12 months Vitals Screenings to include cognitive, depression, and falls Referrals and appointments  In addition, I have reviewed and discussed with patient certain preventive protocols, quality metrics, and best practice recommendations. A written personalized care plan for preventive services as well as general preventive health recommendations were provided to patient.     Durwin Nora, [place]   1/61/0960   Due to this being a virtual visit, the after visit summary with patients personalized plan was offered to patient via mail or my-chart. Patient would like to access on my-chart  Nurse Notes: Patient is asking for  referral to podiatrist for bilateral bunions and toenail problems.

## 2022-07-03 NOTE — Patient Instructions (Signed)
Mandy Shelton , Thank you for taking time to come for your Medicare Wellness Visit. I appreciate your ongoing commitment to your health goals. Please review the following plan we discussed and let me know if I can assist you in the future.   These are the goals we discussed:  Goals      Remain active and independent        This is a list of the screening recommended for you and due dates:  Health Maintenance  Topic Date Due   COVID-19 Vaccine (1) Never done   Zoster (Shingles) Vaccine (1 of 2) Never done   Colon Cancer Screening  06/20/2022   Pneumonia Vaccine (1 of 1 - PCV) 09/01/2022*   Flu Shot  09/20/2022   Medicare Annual Wellness Visit  07/03/2023   Mammogram  03/30/2024   Pap Smear  04/27/2024   DTaP/Tdap/Td vaccine (2 - Td or Tdap) 11/11/2030   DEXA scan (bone density measurement)  Completed   Hepatitis C Screening: USPSTF Recommendation to screen - Ages 45-79 yo.  Completed   HIV Screening  Completed   HPV Vaccine  Aged Out  *Topic was postponed. The date shown is not the original due date.    Advanced directives: Information on Advanced Care Planning can be found at [facility] [place] Advance Health Care Directives Advance Health Care Directives ([email or web address]    Conditions/risks identified: Aim for 30 minutes of exercise or brisk walking, 6-8 glasses of water, and 5 servings of fruits and vegetables each day.  Next appointment: Follow up in one year for your annual wellness visit    Preventive Care 65 Years and Older, Female Preventive care refers to lifestyle choices and visits with your health care provider that can promote health and wellness. What does preventive care include? A yearly physical exam. This is also called an annual well check. Dental exams once or twice a year. Routine eye exams. Ask your health care provider how often you should have your eyes checked. Personal lifestyle choices, including: Daily care of your teeth and gums. Regular  physical activity. Eating a healthy diet. Avoiding tobacco and drug use. Limiting alcohol use. Practicing safe sex. Taking low-dose aspirin every day. Taking vitamin and mineral supplements as recommended by your health care provider. What happens during an annual well check? The services and screenings done by your health care provider during your annual well check will depend on your age, overall health, lifestyle risk factors, and family history of disease. Counseling  Your health care provider may ask you questions about your: Alcohol use. Tobacco use. Drug use. Emotional well-being. Home and relationship well-being. Sexual activity. Eating habits. History of falls. Memory and ability to understand (cognition). Work and work Astronomer. Reproductive health. Screening  You may have the following tests or measurements: Height, weight, and BMI. Blood pressure. Lipid and cholesterol levels. These may be checked every 5 years, or more frequently if you are over 20 years old. Skin check. Lung cancer screening. You may have this screening every year starting at age 47 if you have a 30-pack-year history of smoking and currently smoke or have quit within the past 15 years. Fecal occult blood test (FOBT) of the stool. You may have this test every year starting at age 63. Flexible sigmoidoscopy or colonoscopy. You may have a sigmoidoscopy every 5 years or a colonoscopy every 10 years starting at age 69. Hepatitis C blood test. Hepatitis B blood test. Sexually transmitted disease (STD) testing. Diabetes screening.  This is done by checking your blood sugar (glucose) after you have not eaten for a while (fasting). You may have this done every 1-3 years. Bone density scan. This is done to screen for osteoporosis. You may have this done starting at age 77. Mammogram. This may be done every 1-2 years. Talk to your health care provider about how often you should have regular mammograms. Talk  with your health care provider about your test results, treatment options, and if necessary, the need for more tests. Vaccines  Your health care provider may recommend certain vaccines, such as: Influenza vaccine. This is recommended every year. Tetanus, diphtheria, and acellular pertussis (Tdap, Td) vaccine. You may need a Td booster every 10 years. Zoster vaccine. You may need this after age 40. Pneumococcal 13-valent conjugate (PCV13) vaccine. One dose is recommended after age 58. Pneumococcal polysaccharide (PPSV23) vaccine. One dose is recommended after age 70. Talk to your health care provider about which screenings and vaccines you need and how often you need them. This information is not intended to replace advice given to you by your health care provider. Make sure you discuss any questions you have with your health care provider. Document Released: 03/04/2015 Document Revised: 10/26/2015 Document Reviewed: 12/07/2014 Elsevier Interactive Patient Education  2017 [employer] Prevention in the Home Falls can cause injuries. They can happen to people of all ages. There are many things you can do to make your home safe and to help prevent falls. What can I do on the outside of my home? Regularly fix the edges of walkways and driveways and fix any cracks. Remove anything that might make you trip as you walk through a door, such as a raised step or threshold. Trim any bushes or trees on the path to your home. Use bright outdoor lighting. Clear any walking paths of anything that might make someone trip, such as rocks or tools. Regularly check to see if handrails are loose or broken. Make sure that both sides of any steps have handrails. Any raised decks and porches should have guardrails on the edges. Have any leaves, snow, or ice cleared regularly. Use sand or salt on walking paths during winter. Clean up any spills in your garage right away. This includes oil or grease  spills. What can I do in the bathroom? Use night lights. Install grab bars by the toilet and in the tub and shower. Do not use towel bars as grab bars. Use non-skid mats or decals in the tub or shower. If you need to sit down in the shower, use a plastic, non-slip stool. Keep the floor dry. Clean up any water that spills on the floor as soon as it happens. Remove soap buildup in the tub or shower regularly. Attach bath mats securely with double-sided non-slip rug tape. Do not have throw rugs and other things on the floor that can make you trip. What can I do in the bedroom? Use night lights. Make sure that you have a light by your bed that is easy to reach. Do not use any sheets or blankets that are too big for your bed. They should not hang down onto the floor. Have a firm chair that has side arms. You can use this for support while you get dressed. Do not have throw rugs and other things on the floor that can make you trip. What can I do in the kitchen? Clean up any spills right away. Avoid walking on wet floors. Keep items  that you use a lot in easy-to-reach places. If you need to reach something above you, use a strong step stool that has a grab bar. Keep electrical cords out of the way. Do not use floor polish or wax that makes floors slippery. If you must use wax, use non-skid floor wax. Do not have throw rugs and other things on the floor that can make you trip. What can I do with my stairs? Do not leave any items on the stairs. Make sure that there are handrails on both sides of the stairs and use them. Fix handrails that are broken or loose. Make sure that handrails are as long as the stairways. Check any carpeting to make sure that it is firmly attached to the stairs. Fix any carpet that is loose or worn. Avoid having throw rugs at the top or bottom of the stairs. If you do have throw rugs, attach them to the floor with carpet tape. Make sure that you have a light switch at the  top of the stairs and the bottom of the stairs. If you do not have them, ask someone to add them for you. What else can I do to help prevent falls? Wear shoes that: Do not have high heels. Have rubber bottoms. Are comfortable and fit you well. Are closed at the toe. Do not wear sandals. If you use a stepladder: Make sure that it is fully opened. Do not climb a closed stepladder. Make sure that both sides of the stepladder are locked into place. Ask someone to hold it for you, if possible. Clearly mark and make sure that you can see: Any grab bars or handrails. First and last steps. Where the edge of each step is. Use tools that help you move around (mobility aids) if they are needed. These include: Canes. Walkers. Scooters. Crutches. Turn on the lights when you go into a dark area. Replace any light bulbs as soon as they burn out. Set up your furniture so you have a clear path. Avoid moving your furniture around. If any of your floors are uneven, fix them. If there are any pets around you, be aware of where they are. Review your medicines with your doctor. Some medicines can make you feel dizzy. This can increase your chance of falling. Ask your doctor what other things that you can do to help prevent falls. This information is not intended to replace advice given to you by your health care provider. Make sure you discuss any questions you have with your health care provider. Document Released: 12/02/2008 Document Revised: 07/14/2015 Document Reviewed: 03/12/2014 Elsevier Interactive Patient Education  2017 [employer].

## 2022-07-27 ENCOUNTER — Encounter: Payer: Medicare Other | Admitting: Internal Medicine

## 2022-08-01 ENCOUNTER — Ambulatory Visit: Payer: Self-pay

## 2022-08-01 NOTE — Telephone Encounter (Signed)
  Chief Complaint: breast pain Symptoms: L breast pain, under L breast on outer portion, dizziness at times, HA, possible UTI sx Frequency: 3+ days Pertinent Negatives: Patient denies swelling, redness, or lumps with L breast Disposition: [] ED /[x] Urgent Care (no appt availability in office) / [] Appointment(In office/virtual)/ []  [facility]/ [] Home Care/ [] Refused Recommended Disposition /[] [facility]/ []  Follow-up with PCP Additional Notes: pt states she has something going on with L breast previous and was suppose to FU, had [place] and Mammo done 03/2022. No appts with practice until 08/29/22. Offered UC appt and pt agreed, scheduled for tomorrow at 0945.   Summary: left breast pain   Patient states that she has had pain in her left breast for 3+days. Not appointments available.       Reason for Disposition  [1] Breast pain AND [2] cause is not known  Answer Assessment - Initial Assessment Questions 1. SYMPTOM: "What's the main symptom you're concerned about?"  (e.g., lump, pain, rash, nipple discharge)     L breast pain 2. LOCATION: "Where is the sx located?"     L breast under fold L outer side  3. ONSET: "When did sx  start?"     3+ days  4. PRIOR HISTORY: "Do you have any history of prior problems with your breasts?" (e.g., lumps, cancer, fibrocystic breast disease)     no 5. CAUSE: "What do you think is causing this symptom?"     unsure 6. OTHER SYMPTOMS: "Do you have any other symptoms?" (e.g., fever, breast pain, redness or rash, nipple discharge)     no  Protocols used: Breast Symptoms-A-AH

## 2022-08-02 ENCOUNTER — Ambulatory Visit
Admission: RE | Admit: 2022-08-02 | Discharge: 2022-08-02 | Disposition: A | Discharge status: Home / Self Care | Payer: Medicare Other | Source: Ambulatory Visit | Attending: Urgent Care | Admitting: Urgent Care

## 2022-08-02 VITALS — BP 143/92 | HR 79 | Temp 98.5°F | Resp 20

## 2022-08-02 DIAGNOSIS — N644 Mastodynia: Secondary | ICD-10-CM | POA: Diagnosis not present

## 2022-08-02 DIAGNOSIS — R350 Frequency of micturition: Secondary | ICD-10-CM | POA: Diagnosis not present

## 2022-08-02 DIAGNOSIS — N390 Urinary tract infection, site not specified: Secondary | ICD-10-CM | POA: Insufficient documentation

## 2022-08-02 LAB — POCT URINALYSIS DIP (MANUAL ENTRY)
Bilirubin, UA: NEGATIVE
Blood, UA: NEGATIVE
Glucose, UA: NEGATIVE mg/dL
Ketones, POC UA: NEGATIVE mg/dL
Leukocytes, UA: NEGATIVE
Nitrite, UA: NEGATIVE
Protein Ur, POC: NEGATIVE mg/dL
Spec Grav, UA: <=1.005 — AB (ref 1.010–1.025)
Urobilinogen, UA: 0.2 E.U./dL
pH, UA: 6.5 (ref 5.0–8.0)

## 2022-08-02 MED ORDER — ACETAMINOPHEN 325 MG PO TABS: 650.0000 mg | ORAL_TABLET | Freq: Four times a day (QID) | ORAL | 0 refills | Status: DC | PRN

## 2022-08-02 MED ORDER — CEPHALEXIN 500 MG PO CAPS: 500.0000 mg | ORAL_CAPSULE | Freq: Two times a day (BID) | ORAL | 0 refills | Status: DC

## 2022-08-02 MED ORDER — DICLOFENAC SODIUM 1 % EX GEL: 2.0000 g | Freq: Three times a day (TID) | CUTANEOUS | 0 refills | Status: DC | PRN

## 2022-08-02 NOTE — ED Triage Notes (Signed)
Pt c/o pain to left breast x 4 days-last mammogram and [place] in Feb-pt also urinary freq x 2 weeks-NAD-steady gait

## 2022-08-02 NOTE — Discharge Instructions (Signed)

## 2022-08-02 NOTE — Telephone Encounter (Signed)
Noted  

## 2022-08-02 NOTE — ED Notes (Signed)
In with Mani, PA-C for exam 

## 2022-08-02 NOTE — ED Provider Notes (Signed)
[facility]  Note:  This document was prepared using Conservation officer, historic buildings and may include unintentional dictation errors.  MRN: [id number] DOB: 10/08/1955  Subjective:   Mandy Shelton is a 67 y.o. female presenting for 4-day history of recurrent left breast pain.  This is the same area that has been hurting intermittently for the past few months.  She did have complete workup in February including bilateral mammogram, diagnosed with mammogram and ultrasound.  Results were unremarkable.  She has not followed up with her PCP.  No redness, rash, drainage of pus or bleeding, frank swelling.  She has also had a recurrence of urinary frequency and urgency for the past 2 weeks.  No fever, nausea, vomiting, vaginal discharge, dysuria, hematuria.  Patient does have a history of UTIs.  Hydrates primarily with water.  Has a glass of wine on Sundays.  No current facility-administered medications for this encounter.  Current Outpatient Medications:    amLODipine (NORVASC) 10 MG tablet, Take 1 tablet (10 mg total) by mouth daily., Disp: 90 tablet, Rfl: 1   atorvastatin (LIPITOR) 10 MG tablet, Take 1 tablet (10 mg total) by mouth daily. (Patient not taking: Reported on 07/03/2022), Disp: 90 tablet, Rfl: 3   Calcium Carb-Cholecalciferol 600-20 MG-MCG TABS, , Disp: , Rfl:    Multiple Vitamins-Minerals (ONE-A-DAY FOR HER VITACRAVES PO), Take 1 tablet by mouth once., Disp: , Rfl:    Allergies  Allergen Reactions   Sulfa Antibiotics     Past Medical History:  Diagnosis Date   Hypertension      History reviewed. No pertinent surgical history.  Family History  Problem Relation Age of Onset   Thyroid disease Mother    Breast cancer Neg Hx     Social History   Tobacco Use   Smoking status: Never   Smokeless tobacco: Never  Vaping Use   Vaping Use: Never used  Substance Use Topics   Alcohol use: Yes    Alcohol/week: 3.0 standard drinks of alcohol    Types:  1 Glasses of wine, 2 Shots of liquor per week    Comment: occasionally   Drug use: Never    ROS   Objective:   Vitals: BP (!) 143/92 (BP Location: Right Arm)   Pulse 79   Temp 98.5 F (36.9 C) (Oral)   Resp 20   SpO2 97%   Physical Exam Exam conducted with a chaperone present Restaurant manager, fast food).  Constitutional:      General: She is not in acute distress.    Appearance: Normal appearance. She is well-developed. She is not ill-appearing, toxic-appearing or diaphoretic.  HENT:     Head: Normocephalic and atraumatic.     Nose: Nose normal.     Mouth/Throat:     Mouth: Mucous membranes are moist.     Pharynx: Oropharynx is clear.  Eyes:     General: No scleral icterus.       Right eye: No discharge.        Left eye: No discharge.     Extraocular Movements: Extraocular movements intact.     Conjunctiva/sclera: Conjunctivae normal.  Cardiovascular:     Rate and Rhythm: Normal rate.  Pulmonary:     Effort: Pulmonary effort is normal.  Chest:     Chest wall: Tenderness (over areas outlined) present. No mass, lacerations, deformity, swelling, crepitus or edema. There is no dullness to percussion.    Abdominal:     General: Bowel sounds are normal. There  is no distension.     Palpations: Abdomen is soft. There is no mass.     Tenderness: There is no abdominal tenderness. There is no right CVA tenderness, left CVA tenderness, guarding or rebound.  Skin:    General: Skin is warm and dry.  Neurological:     General: No focal deficit present.     Mental Status: She is alert and oriented to person, place, and time.  Psychiatric:        Mood and Affect: Mood normal.        Behavior: Behavior normal.        Thought Content: Thought content normal.        Judgment: Judgment normal.     Results for orders placed or performed during the hospital encounter of 08/02/22 (from the past 24 hour(s))  POCT urinalysis dipstick     Status: Abnormal   Collection Time: 08/02/22 10:09 AM   Result Value Ref Range   Color, UA yellow yellow   Clarity, UA clear clear   Glucose, UA negative negative mg/dL   Bilirubin, UA negative negative   Ketones, POC UA negative negative mg/dL   Spec Grav, UA <=1.610 (A) 1.010 - 1.025   Blood, UA negative negative   pH, UA 6.5 5.0 - 8.0   Protein Ur, POC negative negative mg/dL   Urobilinogen, UA 0.2 0.2 or 1.0 E.U./dL   Nitrite, UA Negative Negative   Leukocytes, UA Negative Negative    Assessment and Plan :   PDMP not reviewed this encounter.  1. Recurrent UTI   2. Urinary frequency   3. Breast pain in female    Start Keflex to cover for acute cystitis, urine culture pending.  Recommended aggressive hydration, limiting urinary irritants.  On exam, patient actually has superficial tenderness of the breast tissue of both breasts worse to the right side.  I did review her results with her and at this stage would recommend Tylenol, topical diclofenac and very close follow-up with her PCP to see if they would like to pursue repeat or further imaging.  Patient is in agreement.  Counseled patient on potential for adverse effects with medications prescribed/recommended today, ER and return-to-clinic precautions discussed, patient verbalized understanding.    Wallis Bamberg, PA-C 08/02/22 1231

## 2022-08-04 LAB — URINE CULTURE: Culture: 10000 — AB

## 2022-08-17 DIAGNOSIS — H40003 Preglaucoma, unspecified, bilateral: Secondary | ICD-10-CM | POA: Diagnosis not present

## 2022-09-19 IMAGING — DX DG HIP (WITH OR WITHOUT PELVIS) 2-3V*R*
3 series · 3 of 3 positions shown · non-contrast
Comparison: None Available.

CLINICAL DATA: Right hip pain.

EXAM:
DG HIP (WITH OR WITHOUT PELVIS) 2-3V RIGHT

[t pelvis ap]
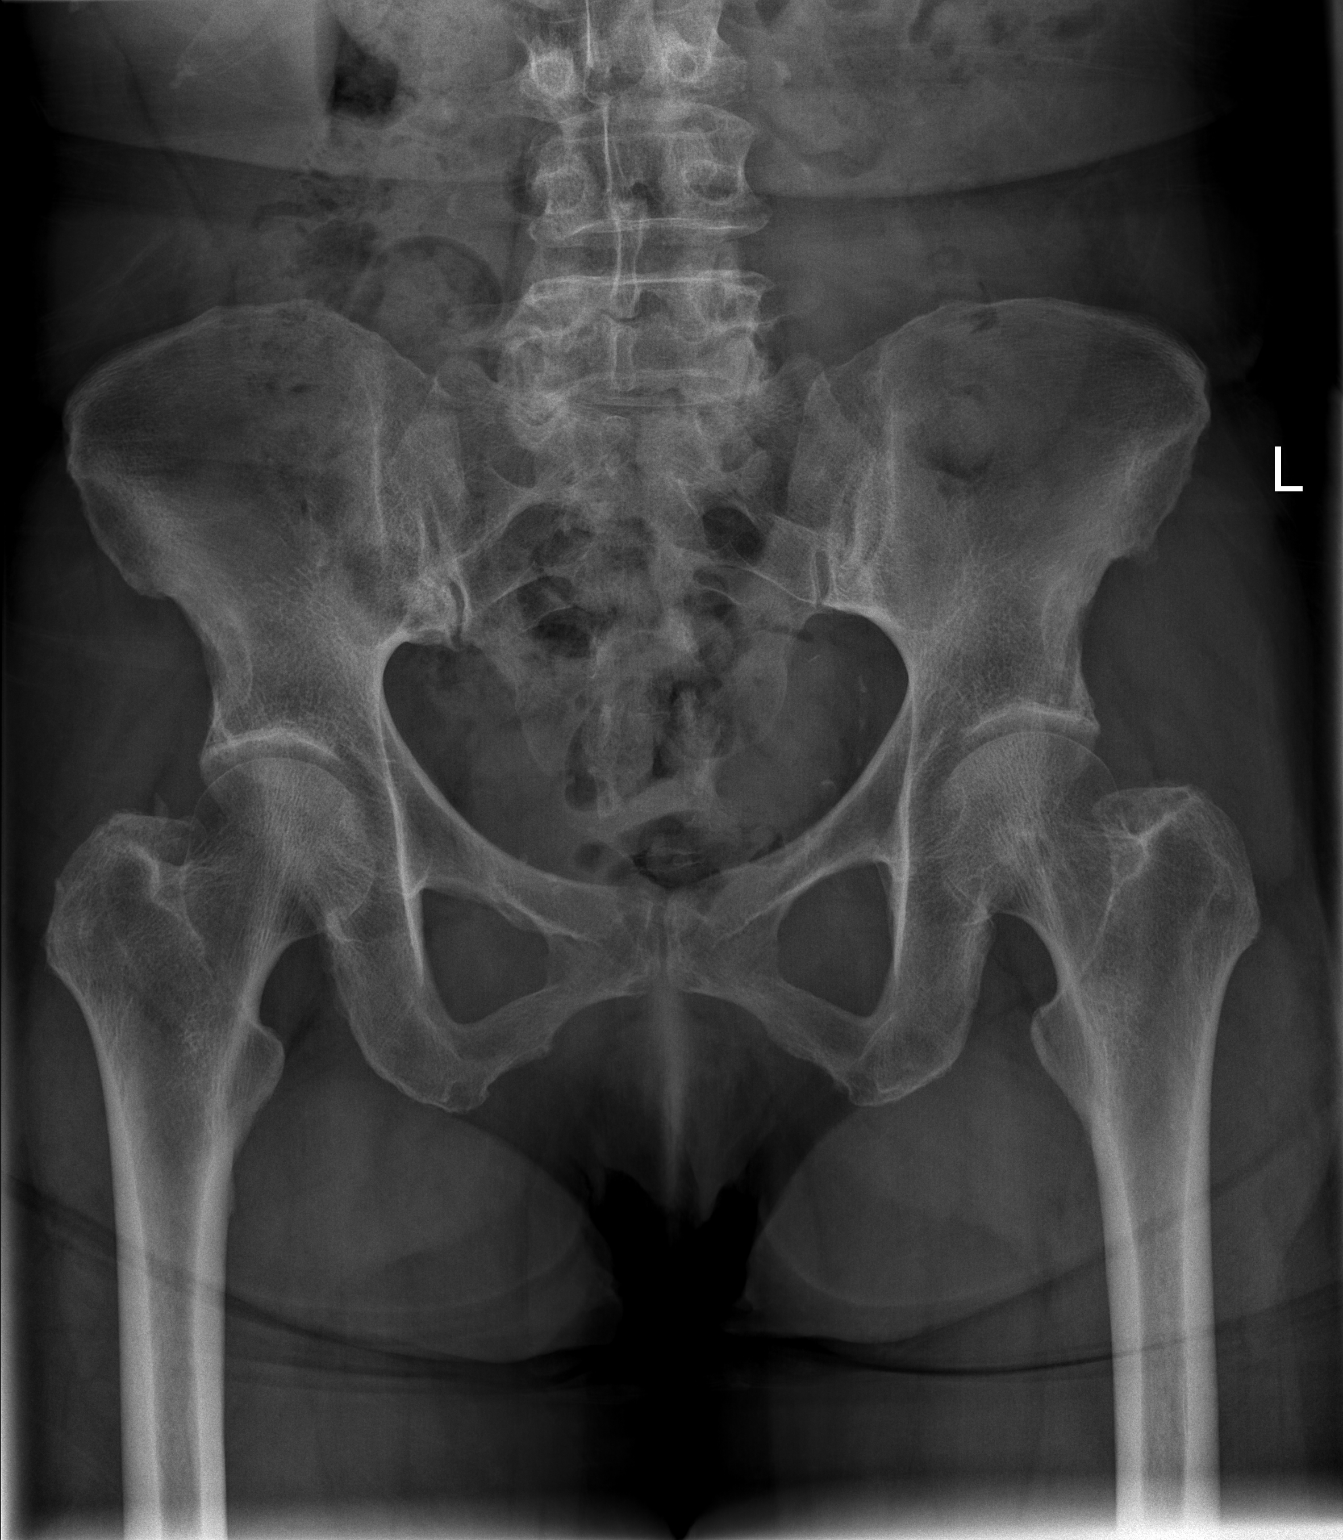

[t hip ap right]
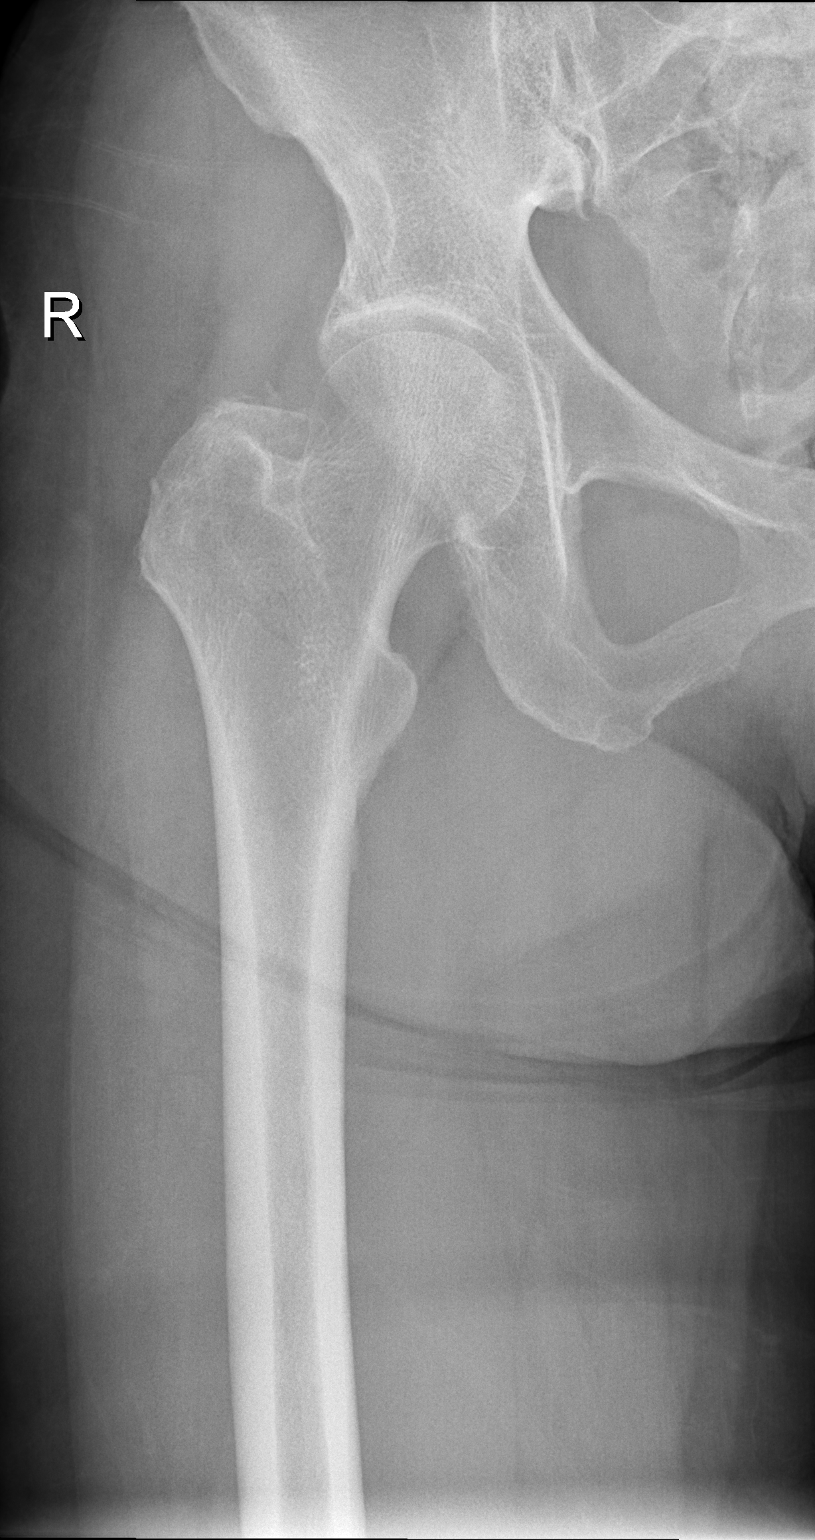

[t hip frog leg right]
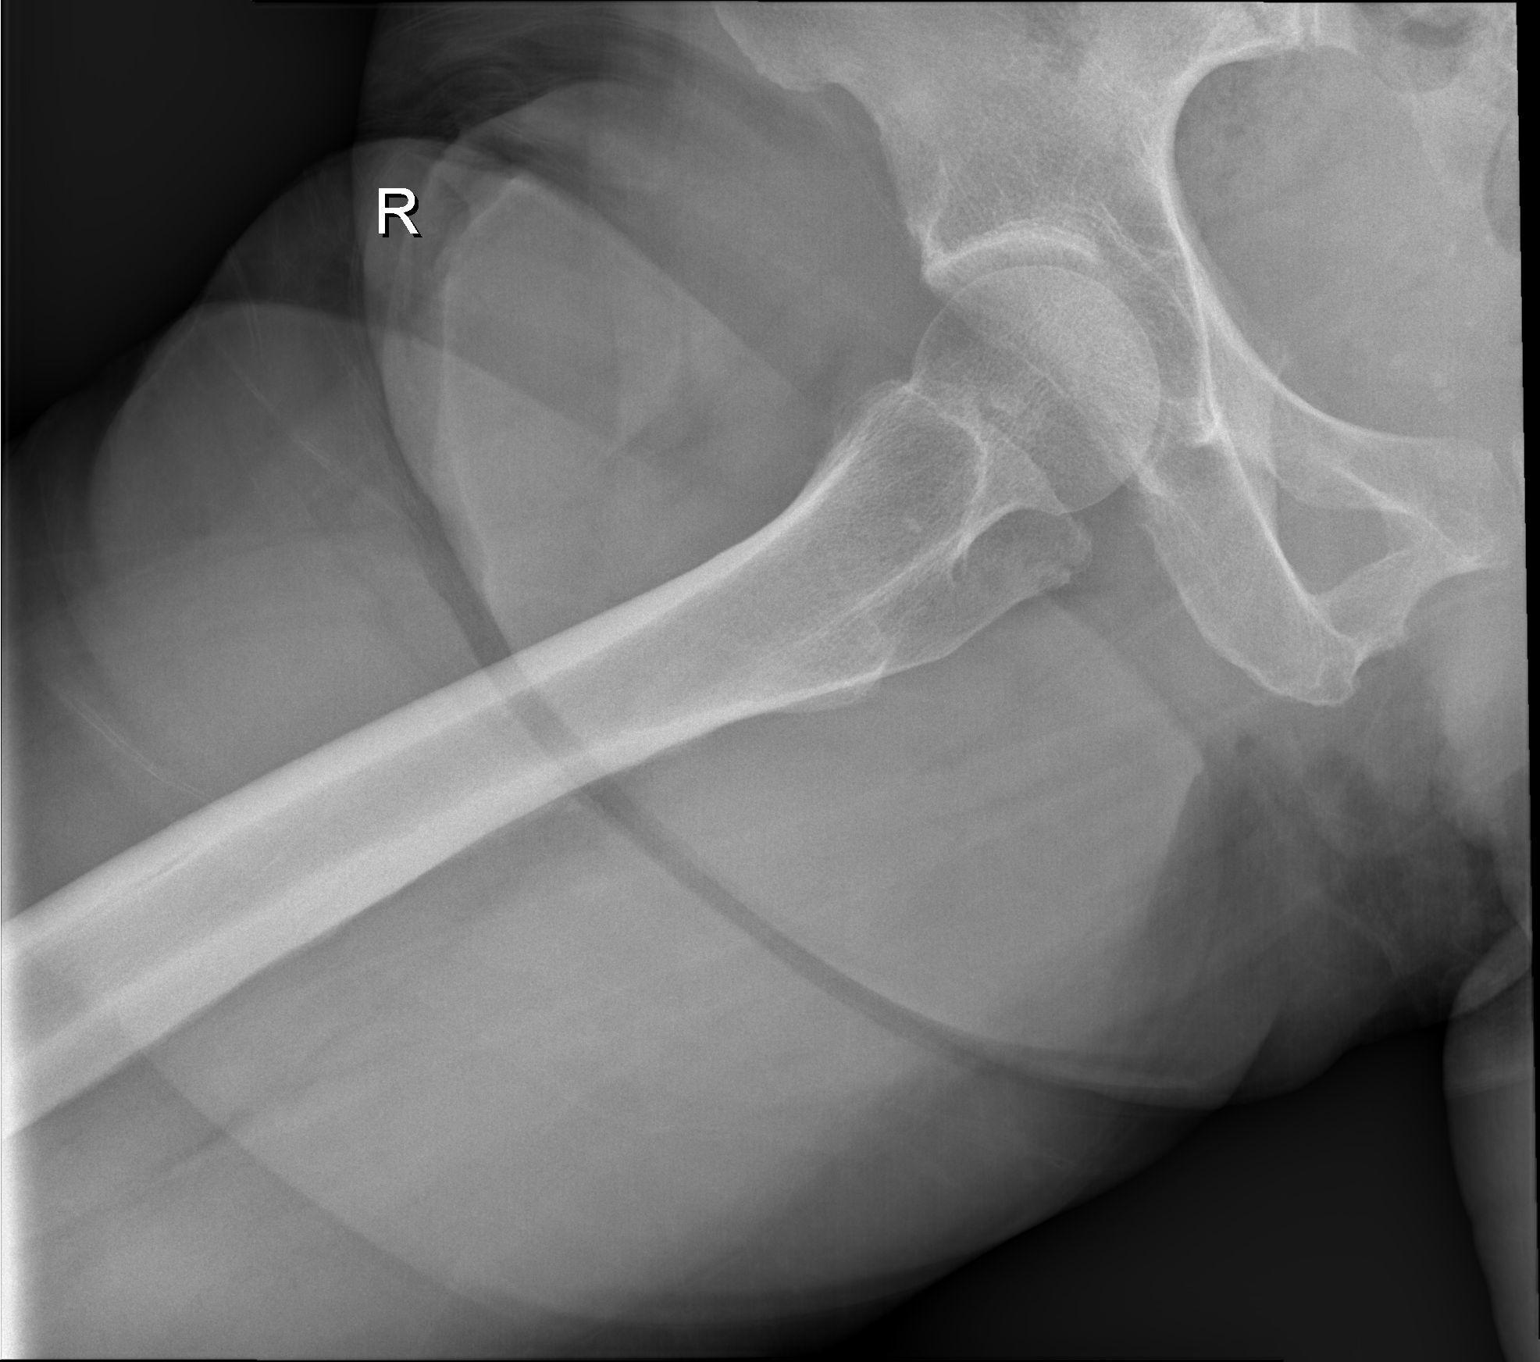

[3 of 3 positions shown; findings below may reference images not displayed]

FINDINGS: There is no evidence of hip fracture or dislocation. There is no
evidence of arthropathy or other focal bone abnormality.
IMPRESSION: Negative.

## 2022-09-19 IMAGING — DX DG LUMBAR SPINE COMPLETE 4+V
5 series · 5 of 5 positions shown · non-contrast
Comparison: CT abdomen and pelvis 08/02/2021

CLINICAL DATA: Lower back pain.

EXAM:
LUMBAR SPINE - COMPLETE 4+ VIEW

[t lumbar spine ap]
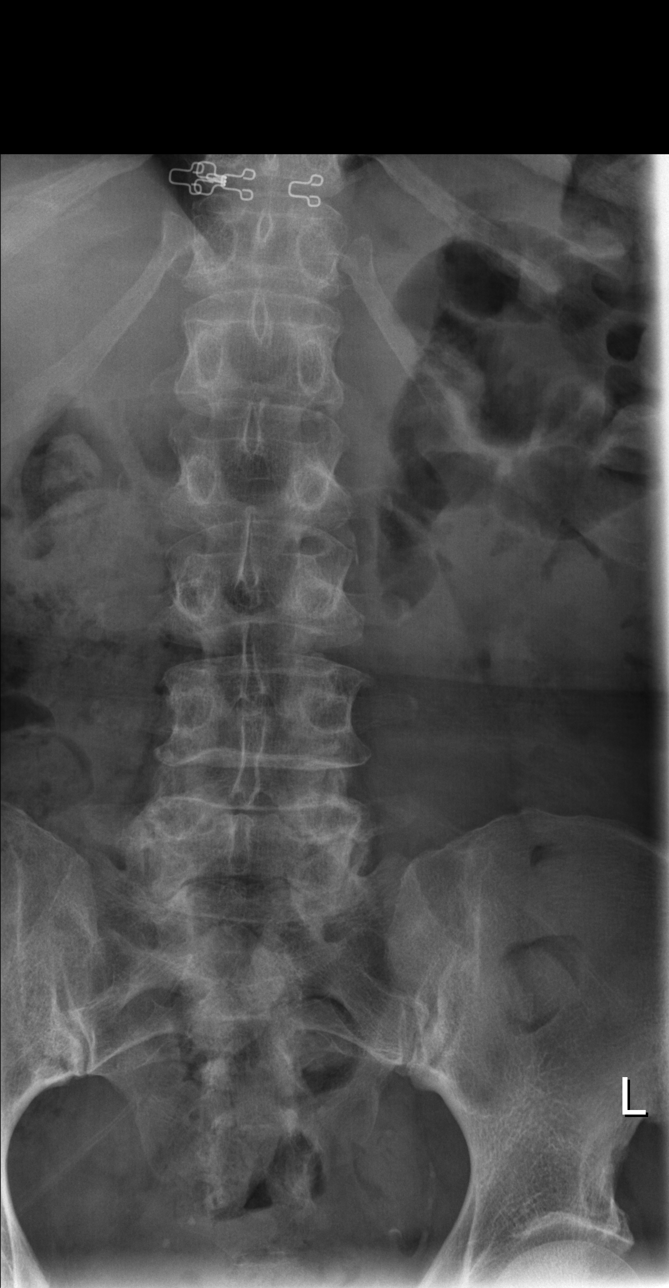

[t lumbar spine obl (1 of 2)]
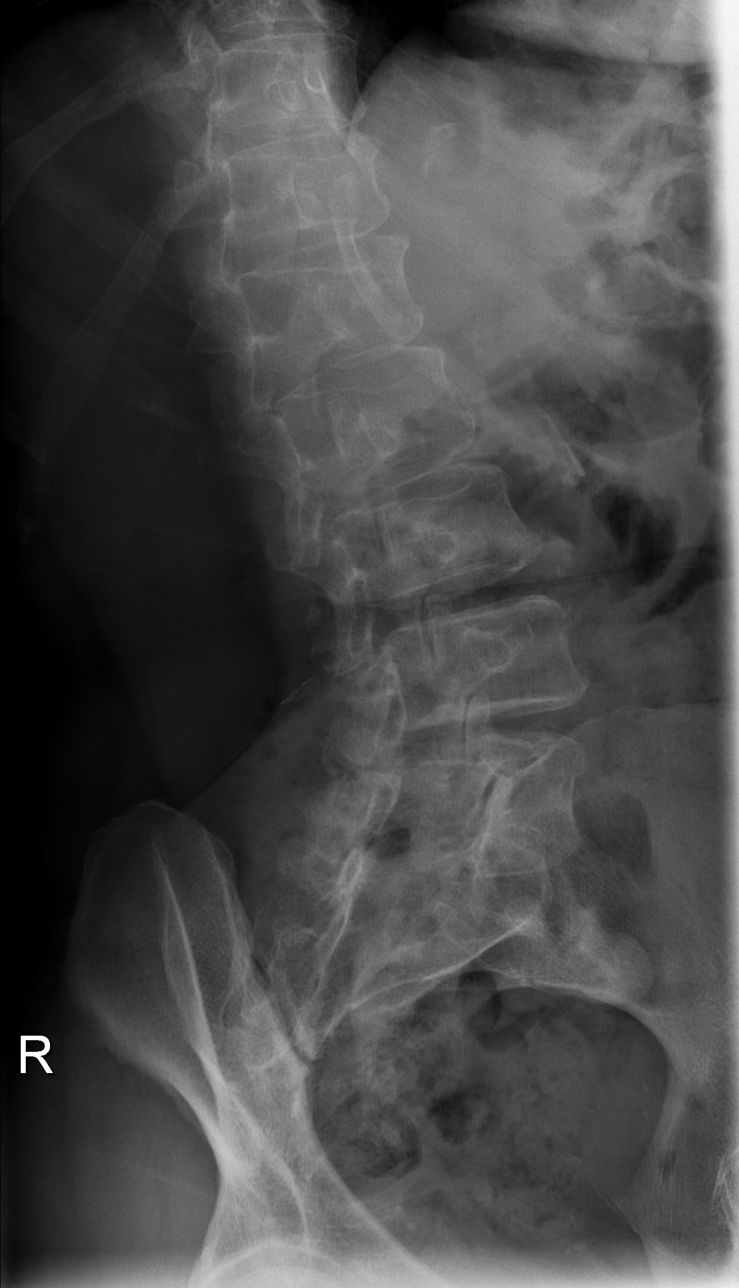

[t lumbar spine obl (2 of 2)]
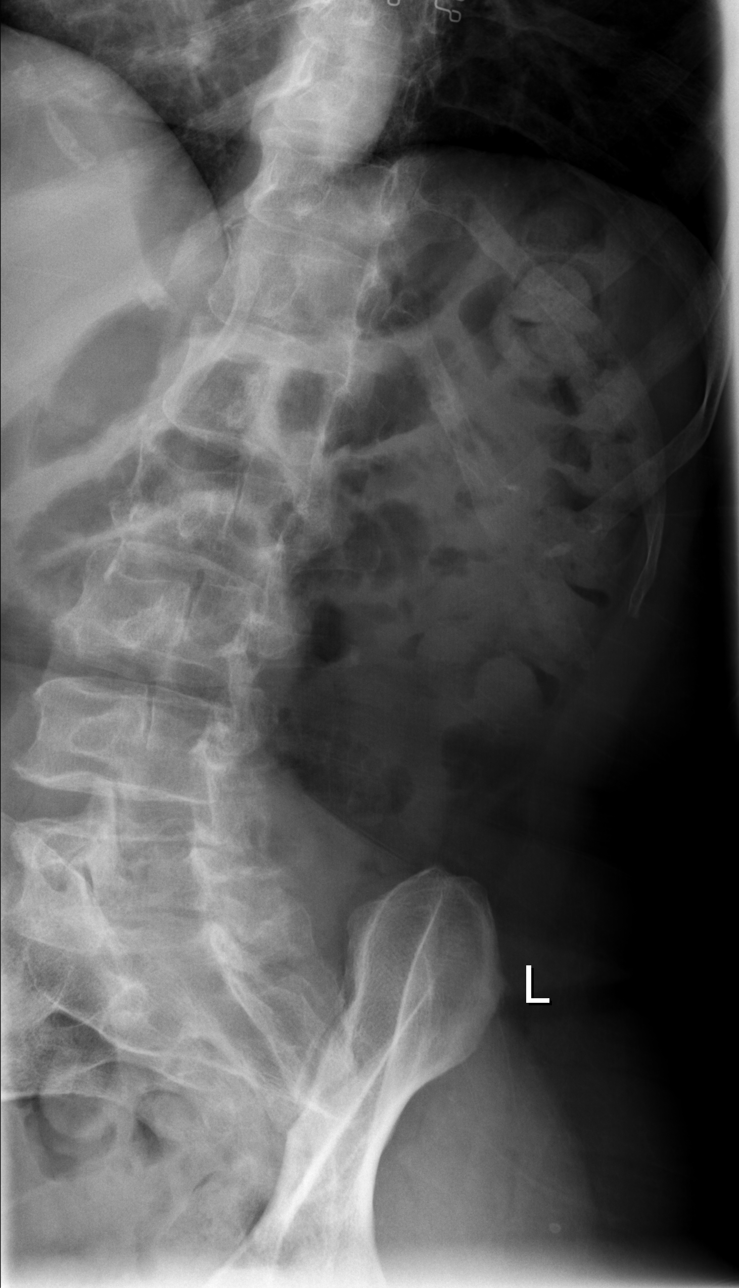

[t lumbar spine lat]
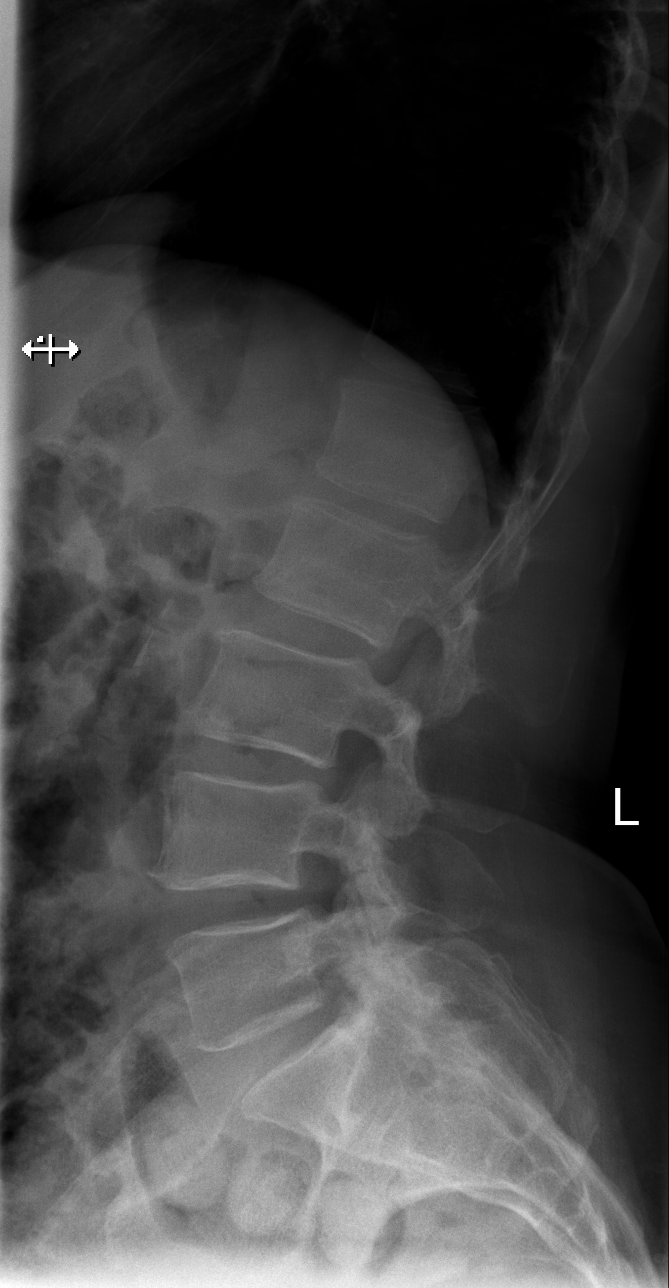

[t lumbar l-5 s-1 spot]
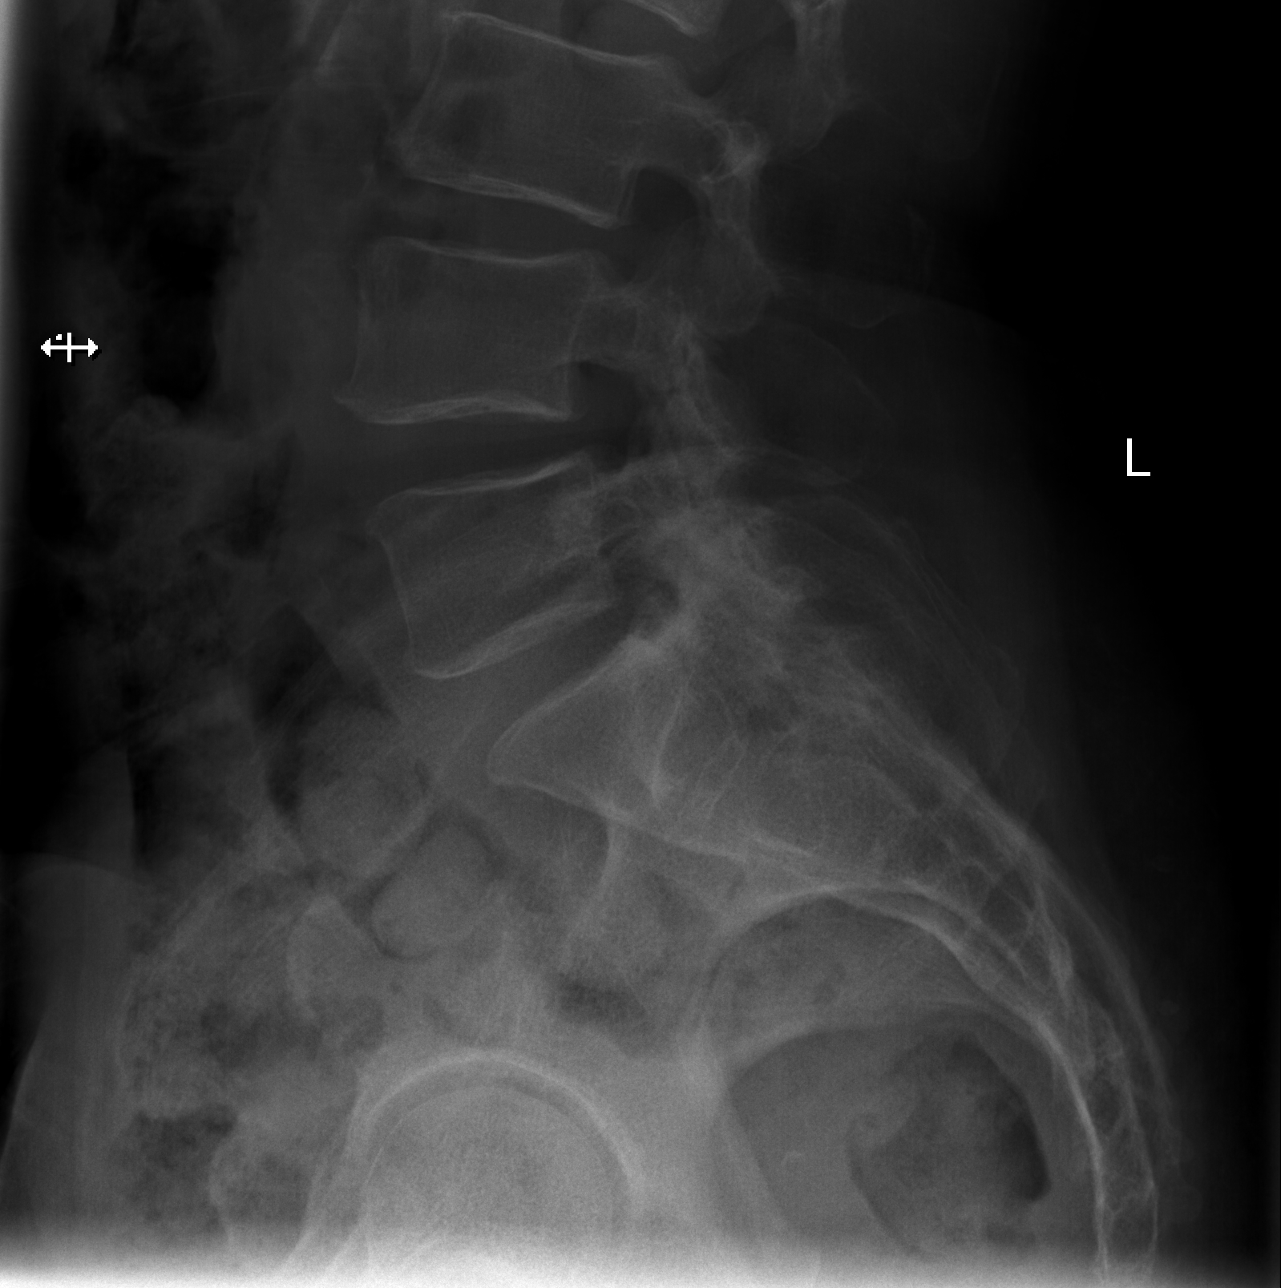

[5 of 5 positions shown; findings below may reference images not displayed]

FINDINGS: There is no evidence of lumbar spine fracture. Alignment is normal.
Intervertebral disc spaces are maintained.
IMPRESSION: Negative.

## 2022-09-21 ENCOUNTER — Encounter: Payer: Medicare Other | Admitting: Internal Medicine

## 2022-10-09 DIAGNOSIS — Z113 Encounter for screening for infections with a predominantly sexual mode of transmission: Secondary | ICD-10-CM | POA: Diagnosis not present

## 2022-10-10 ENCOUNTER — Ambulatory Visit (INDEPENDENT_AMBULATORY_CARE_PROVIDER_SITE_OTHER): Payer: Medicare Other

## 2022-10-10 ENCOUNTER — Ambulatory Visit
Admission: EM | Admit: 2022-10-10 | Discharge: 2022-10-10 | Disposition: A | Discharge status: Home / Self Care | Payer: Medicare Other | Attending: Internal Medicine | Admitting: Internal Medicine

## 2022-10-10 DIAGNOSIS — R0789 Other chest pain: Secondary | ICD-10-CM

## 2022-10-10 DIAGNOSIS — R079 Chest pain, unspecified: Secondary | ICD-10-CM | POA: Diagnosis not present

## 2022-10-10 DIAGNOSIS — G44209 Tension-type headache, unspecified, not intractable: Secondary | ICD-10-CM | POA: Diagnosis not present

## 2022-10-10 MED ORDER — IBUPROFEN 400 MG PO TABS: 400.0000 mg | ORAL_TABLET | Freq: Four times a day (QID) | ORAL | 0 refills | Status: DC | PRN

## 2022-10-10 MED ORDER — CYCLOBENZAPRINE HCL 5 MG PO TABS
5.0000 mg | ORAL_TABLET | Freq: Every evening | ORAL | 0 refills | Status: DC | PRN
Start: 2022-10-10 — End: 2023-02-05
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
## 2022-10-11 ENCOUNTER — Emergency Department (HOSPITAL_BASED_OUTPATIENT_CLINIC_OR_DEPARTMENT_OTHER)
Admission: EM | Admit: 2022-10-11 | Discharge: 2022-10-11 | Disposition: A | Discharge status: Home / Self Care | Payer: Medicare Other | Attending: Emergency Medicine | Admitting: Emergency Medicine

## 2022-10-11 ENCOUNTER — Encounter (HOSPITAL_BASED_OUTPATIENT_CLINIC_OR_DEPARTMENT_OTHER): Payer: Self-pay | Admitting: Emergency Medicine

## 2022-10-11 ENCOUNTER — Other Ambulatory Visit: Payer: Self-pay

## 2022-10-11 DIAGNOSIS — Y92410 Unspecified street and highway as the place of occurrence of the external cause: Secondary | ICD-10-CM | POA: Diagnosis not present

## 2022-10-11 DIAGNOSIS — M791 Myalgia, unspecified site: Secondary | ICD-10-CM | POA: Diagnosis not present

## 2022-10-11 DIAGNOSIS — S8011XA Contusion of right lower leg, initial encounter: Secondary | ICD-10-CM | POA: Diagnosis not present

## 2022-10-11 DIAGNOSIS — M79604 Pain in right leg: Secondary | ICD-10-CM | POA: Diagnosis not present

## 2022-10-11 DIAGNOSIS — M7918 Myalgia, other site: Secondary | ICD-10-CM

## 2022-10-11 NOTE — ED Notes (Signed)
Pt verbalized understanding of d/c instructions, meds, and followup care. Denies questions. VSS, no distress noted. Steady gait to exit with all belongings.  ?
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
## 2022-10-11 NOTE — Discharge Instructions (Signed)
Continue the ibuprofen as prescribed. I would recommend the cyclobenzaprine as well as we discussed. Follow up with your doctor if needed for any ongoing pain.

## 2022-10-23 ENCOUNTER — Telehealth: Payer: Self-pay

## 2022-10-23 ENCOUNTER — Encounter: Payer: Self-pay | Admitting: Pharmacist

## 2022-10-23 NOTE — Telephone Encounter (Signed)
Transition Care Management Follow-up Telephone Call Date of discharge and from where: [facility] 8/22 How have you been since you were released from the hospital? Doing much better Any questions or concerns? No  Items Reviewed: Did the pt receive and understand the discharge instructions provided? Yes  Medications obtained and verified? No  Other? No  Any new allergies since your discharge? No  Dietary orders reviewed? No Do you have support at home? No    Follow up appointments reviewed:  PCP Hospital f/u appt confirmed? Yes  Scheduled to see PCP on 9/5 @ . Specialist Hospital f/u appt confirmed? No  Scheduled to see  on  @ . Are transportation arrangements needed? No  If their condition worsens, is the pt aware to call PCP or go to the Emergency Dept.? Yes Was the patient provided with contact information for the PCP's office or ED? Yes Was to pt encouraged to call back with questions or concerns? Yes

## 2022-10-25 ENCOUNTER — Encounter: Payer: Self-pay | Admitting: Internal Medicine

## 2022-10-25 ENCOUNTER — Ambulatory Visit: Payer: Medicare Other | Attending: Internal Medicine | Admitting: Internal Medicine

## 2022-10-25 VITALS — BP 137/80 | HR 80 | Temp 98.3°F | Ht 62.0 in | Wt 139.0 lb

## 2022-10-25 DIAGNOSIS — I10 Essential (primary) hypertension: Secondary | ICD-10-CM

## 2022-10-25 DIAGNOSIS — M21612 Bunion of left foot: Secondary | ICD-10-CM

## 2022-10-25 DIAGNOSIS — M21611 Bunion of right foot: Secondary | ICD-10-CM | POA: Diagnosis not present

## 2022-10-25 DIAGNOSIS — I700 Atherosclerosis of aorta: Secondary | ICD-10-CM

## 2022-10-25 DIAGNOSIS — E782 Mixed hyperlipidemia: Secondary | ICD-10-CM

## 2022-10-25 DIAGNOSIS — Z2821 Immunization not carried out because of patient refusal: Secondary | ICD-10-CM

## 2022-10-25 DIAGNOSIS — Z1211 Encounter for screening for malignant neoplasm of colon: Secondary | ICD-10-CM

## 2022-10-25 DIAGNOSIS — L659 Nonscarring hair loss, unspecified: Secondary | ICD-10-CM

## 2022-10-25 DIAGNOSIS — M810 Age-related osteoporosis without current pathological fracture: Secondary | ICD-10-CM | POA: Diagnosis not present

## 2022-10-25 MED ORDER — ATORVASTATIN CALCIUM 10 MG PO TABS
10.0000 mg | ORAL_TABLET | Freq: Every day | ORAL | 3 refills | Status: AC
Start: 2022-10-25 — End: ?

## 2022-10-25 MED ORDER — VALSARTAN 40 MG PO TABS
40.0000 mg | ORAL_TABLET | Freq: Every day | ORAL | 3 refills | Status: DC
Start: 2022-10-25 — End: 2023-02-05

## 2022-10-25 MED ORDER — AMLODIPINE BESYLATE 10 MG PO TABS
10.0000 mg | ORAL_TABLET | Freq: Every day | ORAL | 1 refills | Status: DC
Start: 2022-10-25 — End: 2023-07-18

## 2022-10-25 NOTE — Progress Notes (Signed)
Patient ID: Mandy Shelton, female    DOB: [birth year]  MRN: [id number]  CC: Annual Exam (Physical. Med refills. /Requesting referral to podiatrist - bunions /Requesting referral to dermatology - due to hair loss /Instructed to sign ROI for pap)   Subjective: Mandy Shelton is a 67 y.o. female who presents for chronic ds management.  On schedule as physical exam with patient tells me that she does not want a physical exam today.  Her concerns today include:  Pt with overwgh, liver cyst, HTN, urine incontinence, osteoporosis.   Wants referral to derm. Hair loss for past 3 yrs More around edges Hair is nature for >8 yrs.  Does not wear braids; but does wear wigs Does routine conditioning treatments Q wk No feeling cold all the time  Has painful bunions on both feet.  Request referral to podiatry.    HTN: taking Norvasc 10 mg daily  Checking BP daily.  Range 130s/80s Limits salt No CP HL/aortic atherosclerosis no taking Lipitor.  Does not like taking meds The 10-year ASCVD risk score (Arnett DK, et al., 2019) is: 12%   Values used to calculate the score:     Age: 72 years     Sex: Female     Is Non-Hispanic African American: Yes     Diabetic: No     Tobacco smoker: No     Systolic Blood Pressure: 137 mmHg     Is BP treated: Yes     HDL Cholesterol: 74 mg/dL     Total Cholesterol: 227 mg/dL   Osteoporosis: Never took the Fosamax as was discussed 08/2022.  Had dental work done; now has partial Taking Ca/Vit once a day.  Does not recall dose.  HM:  had pap at [facility] last mth.  Declines all vaccines -flu, Shingrix, pneumonia. 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 30 tablet 0   Calcium Carb-Cholecalciferol 600-20 MG-MCG TABS      diclofenac Sodium (VOLTAREN) 1 % GEL Apply 2 g topically 3 (three) times daily as needed. 100 g 0   ibuprofen (ADVIL) 400 MG tablet Take 1 tablet (400 mg total) by mouth every 6 (six) hours as needed. 30 tablet 0   Multiple Vitamins-Minerals (ONE-A-DAY FOR HER VITACRAVES PO) Take 1 tablet by mouth once.     cyclobenzaprine (FLEXERIL) 5 MG tablet Take 1 tablet (5 mg total) by mouth at bedtime as needed for muscle spasms. (Patient not taking: Reported on 10/25/2022) 30 tablet 0   No current facility-administered medications on file prior to visit.    Allergies  Allergen Reactions   Sulfa Antibiotics     Social History   Socioeconomic History   Marital status: Single    Spouse name: Not on file   Number of children: 2   Years of education: Not on file   Highest education level: Bachelor's degree (e.g., BA, AB, BS)  Occupational History   Not on  file  Tobacco Use   Smoking status: Never   Smokeless tobacco: Never  Vaping Use   Vaping status: Never Used  Substance and Sexual Activity   Alcohol use: Yes    Alcohol/week: 3.0 standard drinks of alcohol    Types: 1 Glasses of wine, 2 Shots of liquor per week    Comment: occasionally   Drug use: Never   Sexual activity: Not on file  Other Topics Concern   Not on file  Social History Narrative   Not on file   Social Determinants of Health   Financial Resource Strain: Low Risk  (07/03/2022)   Overall Financial Resource Strain (CARDIA)    Difficulty of Paying Living Expenses: Not hard at all  Food Insecurity: No Food Insecurity (07/03/2022)   Hunger Vital Sign    Worried About Running Out of Food in the Last Year: Never  true    Ran Out of Food in the Last Year: Never true  Transportation Needs: No Transportation Needs (07/03/2022)   PRAPARE - Administrator, Civil Service (Medical): No    Lack of Transportation (Non-Medical): No  Physical Activity: Insufficiently Active (07/03/2022)   Exercise Vital Sign    Days of Exercise per Week: 3 days    Minutes of Exercise per Session: 30 min  Stress: No Stress Concern Present (07/03/2022)   [employer] of Occupational Health - Occupational Stress Questionnaire    Feeling of Stress : Not at all  Social Connections: Moderately Isolated (07/03/2022)   Social Connection and Isolation Panel [NHANES]    Frequency of Communication with Friends and Family: More than three times a week    Frequency of Social Gatherings with Friends and Family: Three times a week    Attends Religious Services: 1 to 4 times per year    Active Member of Clubs or Organizations: No    Attends Banker Meetings: Never    Marital Status: Never married  Intimate Partner Violence: Not At Risk (07/03/2022)   Humiliation, Afraid, Rape, and Kick questionnaire    Fear of Current or Ex-Partner: No    Emotionally Abused: No    Physically Abused: No    Sexually Abused: No    Family History  Problem Relation Age of Onset   Thyroid disease Mother    Breast cancer Neg Hx     No past surgical history on file.  ROS: Review of Systems Negative except as stated above  PHYSICAL EXAM: BP 137/80 (BP Location: Left Arm, Patient Position: Sitting, Cuff Size: Normal)   Pulse 80   Temp 98.3 F (36.8 C) (Oral)   Ht 5\' 2"  (1.575 m)   Wt 139 lb (63 kg)   SpO2 99%   BMI 25.42 kg/m   Physical Exam Repeat blood pressure 170/84. General appearance - alert, well appearing, and in no distress Mental status - normal mood, behavior, speech, dress, motor activity, and thought processes Neck - supple, no significant adenopathy Chest - clear to auscultation, no wheezes, rales or  rhonchi, symmetric air entry Heart - normal rate, regular rhythm, normal S1, S2, no murmurs, rubs, clicks or gallops Extremities - peripheral pulses normal, no pedal edema, no clubbing or cyanosis Skin -patient with hair loss notably around the edges. MSK: Mildly erythematous bunions on both big toes.  She is flat-footed.     Latest Ref Rng & Units 05/17/2022    4:30 PM 08/02/2021    4:54 PM 11/10/2020    3:33 PM  CMP  Glucose 70 - 99 mg/dL 90  90  94   BUN 8 - 27 mg/dL 11  9  12    Creatinine 0.57 - 1.00 mg/dL 4.33  2.95  1.88   Sodium 134 - 144 mmol/L 140  137  138   Potassium 3.5 - 5.2 mmol/L 4.6  4.0  4.9   Chloride 96 - 106 mmol/L 100  103  99   CO2 20 - 29 mmol/L 27  24  25    Calcium 8.7 - 10.3 mg/dL 41.6  9.3  9.9   Total Protein 6.5 - 8.1 g/dL  7.7  8.1   Total Bilirubin 0.3 - 1.2 mg/dL  0.8  0.3   Alkaline Phos 38 - 126 U/L  48  65   AST 15 - 41 U/L  19  26   ALT 0 - 44 U/L  16  15    Lipid Panel     Component Value Date/Time   CHOL 227 (H) 05/17/2022 1630   TRIG 93 05/17/2022 1630   HDL 74 05/17/2022 1630   CHOLHDL 3.1 05/17/2022 1630   LDLCALC 137 (H) 05/17/2022 1630    CBC    Component Value Date/Time   WBC 6.6 08/02/2021 1654   RBC 4.36 08/02/2021 1654   HGB 12.8 08/02/2021 1654   HGB 13.0 11/10/2020 1533   HCT 39.4 08/02/2021 1654   HCT 39.0 11/10/2020 1533   PLT 222 08/02/2021 1654   PLT 268 11/10/2020 1533   MCV 90.4 08/02/2021 1654   MCV 89 11/10/2020 1533   MCH 29.4 08/02/2021 1654   MCHC 32.5 08/02/2021 1654   RDW 12.3 08/02/2021 1654   RDW 12.5 11/10/2020 1533   LYMPHSABS 3.2 08/02/2021 1654   MONOABS 0.5 08/02/2021 1654   EOSABS 0.2 08/02/2021 1654   BASOSABS 0.1 08/02/2021 1654    ASSESSMENT AND PLAN:  1. Essential hypertension Not at goal.  Continue Norvasc 10 mg daily.  Add Diovan 40 mg daily.  DASH diet encouraged. - amLODipine (NORVASC) 10 MG tablet; Take 1 tablet (10 mg total) by mouth daily.  Dispense: 90 tablet; Refill: 1 -  valsartan (DIOVAN) 40 MG tablet; Take 1 tablet (40 mg total) by mouth daily.  Dispense: 90 tablet; Refill: 3  2. Bilateral bunions - Ambulatory referral to Podiatry  3. Hair loss - Ambulatory referral to Dermatology - TSH+T4F+T3Free  4. Age-related osteoporosis without current pathological fracture Patient not interested on starting the Fosamax or other oral medication.  Discussed increased risk of fragility fractures.  Told her about some of the injectables like Prolia and she states she may be interested in that. Discussed and encouraged weightbearing exercises. Continue calcium and vitamin D supplement.  Advised that she should be on calcium plus vitamin D 600/401 tablet twice a day. - Ambulatory referral to Endocrinology  5. Mixed hyperlipidemia Strongly advised taking the atorvastatin as prescribed given her ASCVD and history of aortic atherosclerosis.  She is agreeable to restarting the medicine. - atorvastatin (LIPITOR) 10 MG tablet; Take 1 tablet (10 mg total) by mouth daily.  Dispense: 90 tablet; Refill: 3 - Hepatic Function Panel  6. Screening for colon cancer Referred to gastroenterology for colonoscopy  7. Influenza vaccination declined Recommended.  Patient declined.  8. Pneumococcal vaccination declined Recommended.  Patient declined.  9.  Aortic atherosclerosis See #5 above Patient was given the opportunity to ask questions.  Patient verbalized understanding of the plan and was able to repeat key elements of the plan.   This  documentation was completed using Paediatric nurse.  Any transcriptional errors are unintentional.  Orders Placed This Encounter  Procedures   TSH+T4F+T3Free   Hepatic Function Panel   Ambulatory referral to Dermatology   Ambulatory referral to Podiatry   Ambulatory referral to Endocrinology     Requested Prescriptions   Signed Prescriptions Disp Refills   amLODipine (NORVASC) 10 MG tablet 90 tablet 1    Sig: Take  1 tablet (10 mg total) by mouth daily.   atorvastatin (LIPITOR) 10 MG tablet 90 tablet 3    Sig: Take 1 tablet (10 mg total) by mouth daily.   valsartan (DIOVAN) 40 MG tablet 90 tablet 3    Sig: Take 1 tablet (40 mg total) by mouth daily.    Return in about 4 months (around 02/24/2023) for sign release to get PAP results from [facility].  Jonah Blue, MD, FACP

## 2022-10-26 ENCOUNTER — Other Ambulatory Visit: Payer: Self-pay | Admitting: Internal Medicine

## 2022-10-26 ENCOUNTER — Ambulatory Visit (INDEPENDENT_AMBULATORY_CARE_PROVIDER_SITE_OTHER): Payer: Medicare Other

## 2022-10-26 ENCOUNTER — Encounter: Payer: Self-pay | Admitting: Podiatry

## 2022-10-26 ENCOUNTER — Other Ambulatory Visit: Payer: Self-pay

## 2022-10-26 ENCOUNTER — Ambulatory Visit (INDEPENDENT_AMBULATORY_CARE_PROVIDER_SITE_OTHER): Payer: Medicare Other | Admitting: Podiatry

## 2022-10-26 DIAGNOSIS — B353 Tinea pedis: Secondary | ICD-10-CM | POA: Diagnosis not present

## 2022-10-26 DIAGNOSIS — M19071 Primary osteoarthritis, right ankle and foot: Secondary | ICD-10-CM | POA: Diagnosis not present

## 2022-10-26 DIAGNOSIS — M21611 Bunion of right foot: Secondary | ICD-10-CM | POA: Diagnosis not present

## 2022-10-26 DIAGNOSIS — M21612 Bunion of left foot: Secondary | ICD-10-CM

## 2022-10-26 DIAGNOSIS — I10 Essential (primary) hypertension: Secondary | ICD-10-CM

## 2022-10-26 DIAGNOSIS — M19072 Primary osteoarthritis, left ankle and foot: Secondary | ICD-10-CM | POA: Diagnosis not present

## 2022-10-26 DIAGNOSIS — M21619 Bunion of unspecified foot: Secondary | ICD-10-CM

## 2022-10-26 DIAGNOSIS — M2141 Flat foot [pes planus] (acquired), right foot: Secondary | ICD-10-CM | POA: Diagnosis not present

## 2022-10-26 DIAGNOSIS — M2142 Flat foot [pes planus] (acquired), left foot: Secondary | ICD-10-CM | POA: Diagnosis not present

## 2022-10-26 DIAGNOSIS — M2012 Hallux valgus (acquired), left foot: Secondary | ICD-10-CM | POA: Diagnosis not present

## 2022-10-26 DIAGNOSIS — M2011 Hallux valgus (acquired), right foot: Secondary | ICD-10-CM | POA: Diagnosis not present

## 2022-10-26 LAB — HEPATIC FUNCTION PANEL
ALT: 20 IU/L (ref 0–32)
AST: 22 IU/L (ref 0–40)
Albumin: 4.7 g/dL (ref 3.9–4.9)
Alkaline Phosphatase: 65 IU/L (ref 44–121)
Bilirubin Total: 0.5 mg/dL (ref 0.0–1.2)
Bilirubin, Direct: 0.13 mg/dL (ref 0.00–0.40)
Total Protein: 8 g/dL (ref 6.0–8.5)

## 2022-10-26 LAB — TSH+T4F+T3FREE
Free T4: 1.53 ng/dL (ref 0.82–1.77)
T3, Free: 2.8 pg/mL (ref 2.0–4.4)
TSH: 0.991 u[IU]/mL (ref 0.450–4.500)

## 2022-10-26 MED ORDER — KETOCONAZOLE 2 % EX CREA: 1.0000 | TOPICAL_CREAM | Freq: Every day | CUTANEOUS | 2 refills | Status: AC

## 2022-10-26 NOTE — Progress Notes (Addendum)
  Subjective:  Patient ID: Mandy Shelton, female    DOB: 1955-03-16,   MRN: [id number]  Chief Complaint  Patient presents with   Bunions    Pt presents  for Bilateral bunions on the great toes. Pt stated that she had them since her late 20's but it did not start bothering since recently.     67 y.o. female presents for concern as above . Denies any other pedal complaints. Denies n/v/f/c.   Past Medical History:  Diagnosis Date   Hypertension     Objective:  Physical Exam: Vascular: DP/PT pulses 2/4 bilateral. CFT <3 seconds. Normal hair growth on digits. No edema.  Skin. No lacerations or abrasions bilateral feet.  Musculoskeletal: MMT 5/5 bilateral lower extremities in DF, PF, Inversion and Eversion. Deceased ROM in DF of ankle joint. 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 No pain with ROM of the joints. Difficult to reduce deformity and mild hypermobility of the first MPJ.  Neurological: Sensation intact to light touch.   Assessment:   1. Bilateral bunions      Plan:  Patient was evaluated and treated and all questions answered. -Xrays reviewed. 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 IM 1-2 angle on left is about 14 degrees and on right about 15-16 degrees.  -Discussed HAV and treatment options;conservative and surgical management; risks, benefits, alternatives discussed. All patient's questions answered. -Discussed padding and wide shoe gear.   -Recommend continue with good supportive shoes and inserts.  -Discussed surgical option in the future and would likely consider lapidus vs MPJ fusion for her. She is not ready for surgery at this time.   -Discussed tinea pedis and ketoconazole provided.  -Patient to return to office as needed or sooner if condition worsens.   Louann Sjogren, DPM

## 2022-10-26 NOTE — Addendum Note (Signed)
Addended by: Louann Sjogren R on: 10/26/2022 12:45 PM   Modules accepted: Orders

## 2022-11-20 ENCOUNTER — Ambulatory Visit: Payer: Medicare Other | Admitting: Internal Medicine

## 2022-11-28 DIAGNOSIS — H40013 Open angle with borderline findings, low risk, bilateral: Secondary | ICD-10-CM | POA: Diagnosis not present

## 2023-01-08 DIAGNOSIS — L658 Other specified nonscarring hair loss: Secondary | ICD-10-CM | POA: Diagnosis not present

## 2023-01-08 DIAGNOSIS — L648 Other androgenic alopecia: Secondary | ICD-10-CM | POA: Diagnosis not present

## 2023-02-05 ENCOUNTER — Encounter: Payer: Self-pay | Admitting: Internal Medicine

## 2023-02-05 ENCOUNTER — Telehealth: Payer: Self-pay | Admitting: Pharmacist

## 2023-02-05 ENCOUNTER — Other Ambulatory Visit (HOSPITAL_BASED_OUTPATIENT_CLINIC_OR_DEPARTMENT_OTHER): Payer: Self-pay | Admitting: Pharmacist

## 2023-02-05 DIAGNOSIS — Z7189 Other specified counseling: Secondary | ICD-10-CM

## 2023-02-05 NOTE — Telephone Encounter (Signed)
Dr. Laural Benes,   Please see patient's MyChart message from this morning. She is a [facility]-contracted patient. I was doing some quality measure review this morning and she showed up as needing to fill her valsartan. She confirmed her MyChart message and informed me she is not taking the valsartan. She is only taking amlodipine, vitamin D3 + calcium, and a MVI.   She confirms BP at home as been in 110s/60s on amlodipine alone.   The fax she is referring to from [facility] is an adherence reminder for her to be filling 90-ds of her valsartan. I have removed this from her list since she is not taking and reports goal home BP. That way, she will not be counted as non-adherent. I also rescheduled her for a Feb appt per request.   Butch Penny, PharmD, BCACP, CPP Clinical Pharmacist [facility] [phone]

## 2023-02-05 NOTE — Progress Notes (Signed)
Pharmacy Quality Measure Review  This patient is appearing on a report for being at risk of failing the adherence measure for hypertension (ACEi/ARB) medications this calendar year.   Medication: valsartan Last fill date: 10/26/2022 for 90 day supply  Patient is no longer taking. Endorses home BP 110s/60s with amlodipine monotherapy. Message sent to PCP. Will remove valsartan from her list  .   Butch Penny, PharmD, BCACP, CPP Clinical Pharmacist [facility] [phone]

## 2023-02-28 ENCOUNTER — Ambulatory Visit: Payer: Medicare Other | Admitting: Internal Medicine

## 2023-03-11 ENCOUNTER — Telehealth: Payer: Self-pay | Admitting: Internal Medicine

## 2023-03-11 NOTE — Telephone Encounter (Signed)
Pt is calling to request a referral to [facility] [address] [place], [place]. [address] [phone] Dr. Valli Glance CabiFor bunions on both feet. Pt was previously referred to [facility] [address] [place], [place] [address] [place] [phone] Pt not completely satisfied with [facility].   If patient can get the referral she can be seen on Thursday 01/2/325 at 4:30pm  CB- [phone]

## 2023-03-13 NOTE — Telephone Encounter (Signed)
Referral Sent  Referral to [facility] [address], [place], [place] [address], [place] Phone: [phone] / Fax  [phone]

## 2023-03-28 ENCOUNTER — Ambulatory Visit: Payer: Medicare Other | Admitting: Endocrinology

## 2023-04-04 ENCOUNTER — Ambulatory Visit: Payer: Medicare Other | Admitting: Internal Medicine

## 2023-04-17 ENCOUNTER — Other Ambulatory Visit: Payer: Self-pay | Admitting: Internal Medicine

## 2023-04-17 DIAGNOSIS — Z1231 Encounter for screening mammogram for malignant neoplasm of breast: Secondary | ICD-10-CM

## 2023-04-24 ENCOUNTER — Ambulatory Visit
Admission: RE | Admit: 2023-04-24 | Discharge: 2023-04-24 | Disposition: A | Discharge status: Home / Self Care | Payer: Medicare Other | Source: Ambulatory Visit | Attending: Internal Medicine | Admitting: Internal Medicine

## 2023-04-24 DIAGNOSIS — Z1231 Encounter for screening mammogram for malignant neoplasm of breast: Secondary | ICD-10-CM

## 2023-04-27 ENCOUNTER — Encounter: Payer: Self-pay | Admitting: Internal Medicine

## 2023-05-16 ENCOUNTER — Telehealth: Payer: Self-pay

## 2023-05-16 NOTE — Telephone Encounter (Signed)
 Patient was called and informed that this was an auto generated call and she does not have to worry since she is not a diabetic.     Copied from CRM [id number]. Topic: Appointments - Scheduling Inquiry for Clinic >> May 15, 2023 12:25 PM Mandy Shelton wrote: Reason for CRM: Patient calling in asking about the appt that is scheduled for 05/20/23. MyChart shows  Your appointment at [facility] is May 20, 2023. MyChart users need to [facility] in prior to your visit. Bring diabetic devices & blood sugar logs.   Patient states she is not diabetic.   Please call patient regarding this issue,  Phone #   [phone]  ok to leave a detailed message

## 2023-05-20 ENCOUNTER — Ambulatory Visit: Payer: Medicare Other | Admitting: Endocrinology

## 2023-05-30 ENCOUNTER — Telehealth: Payer: Self-pay | Admitting: Internal Medicine

## 2023-05-30 NOTE — Telephone Encounter (Signed)
 FYI  Copied from CRM [id number]. Topic: General - Other >> May 30, 2023 10:52 AM Clayton Bibles wrote:  Reason for CRM:  Nettie Elm with [employer] called. She wanted to let Dr. Laural Benes know that she can sign in to the portal and view Sashia's health assessment and care plan. Thank you

## 2023-07-09 ENCOUNTER — Ambulatory Visit: Payer: Medicare Other | Attending: Internal Medicine

## 2023-07-09 VITALS — Ht 62.0 in | Wt 143.0 lb

## 2023-07-09 DIAGNOSIS — Z0000 Encounter for general adult medical examination without abnormal findings: Secondary | ICD-10-CM

## 2023-07-09 DIAGNOSIS — Z1211 Encounter for screening for malignant neoplasm of colon: Secondary | ICD-10-CM

## 2023-07-09 NOTE — Patient Instructions (Signed)
 Mandy Shelton , Thank you for taking time out of your busy schedule to complete your Annual Wellness Visit with me. I enjoyed our conversation and look forward to speaking with you again next year. I, as well as your care team,  appreciate your ongoing commitment to your health goals. Please review the following plan we discussed and let me know if I can assist you in the future. Your Game plan/ To Do List    Referrals: If you haven't heard from the office you've been referred to, please reach out to them at the phone provided.  Elodia Hailstone, PA [facility] - [facility] [address] [place], [place] [address]  Follow up Visits: Next Medicare AWV with our clinical staff: 07/14/2024 at 11:10 a.m. PHONE VISIT with Nurse Health Advisor   Have you seen your provider in the last 6 months (3 months if uncontrolled diabetes)? Yes Next Office Visit with your provider: Due in September 2025 for PHYSICAL  Clinician Recommendations:  Aim for 30 minutes of exercise or brisk walking, 6-8 glasses of water, and 5 servings of fruits and vegetables each day.       This is a list of the screening recommended for you and due dates:  Health Maintenance  Topic Date Due   Colon Cancer Screening  06/20/2022   COVID-19 Vaccine (1 - 2024-25 season) Never done   Pneumonia Vaccine (1 of 1 - PCV) 10/25/2023*   Zoster (Shingles) Vaccine (1 of 2) 01/20/2024*   Flu Shot  09/20/2023   Medicare Annual Wellness Visit  07/08/2024   Mammogram  04/23/2025   DTaP/Tdap/Td vaccine (2 - Td or Tdap) 11/11/2030   DEXA scan (bone density measurement)  Completed   Hepatitis C Screening  Completed   HPV Vaccine  Aged Out   Meningitis B Vaccine  Aged Out  *Topic was postponed. The date shown is not the original due date.    Advanced directives: (Declined) Advance directive discussed with you today. Even though you declined this today, please call our office should you  change your mind, and we can give you the proper paperwork for you to fill out. Advance Care Planning is important because it:  [x]  Makes sure you receive the medical care that is consistent with your values, goals, and preferences  [x]  It provides guidance to your family and loved ones and reduces their decisional burden about whether or not they are making the right decisions based on your wishes.  Follow the link provided in your after visit summary or read over the paperwork we have mailed to you to help you started getting your Advance Directives in place. If you need assistance in completing these, please reach out to us  so that we can help you!  See attachments for Preventive Care and Fall Prevention Tips.

## 2023-07-09 NOTE — Progress Notes (Signed)
 Because this visit was a virtual/telehealth visit,  certain criteria was not obtained, such a blood pressure, CBG if applicable, and timed get up and go. Any medications not marked as "taking" were not mentioned during the medication reconciliation part of the visit. Any vitals not documented were not able to be obtained due to this being a telehealth visit or patient was unable to self-report a recent blood pressure reading due to a lack of equipment at home via telehealth. Vitals that have been documented are verbally provided by the patient.   Subjective:   Mandy Shelton is a 68 y.o. who presents for a Medicare Wellness preventive visit.  As a reminder, Annual Wellness Visits don't include a physical exam, and some assessments may be limited, especially if this visit is performed virtually. We may recommend an in-person follow-up visit with your provider if needed.  Visit Complete: Virtual I connected with  Mandy Shelton on 07/09/23 by a audio enabled telemedicine application and verified that I am speaking with the correct person using two identifiers.  Patient Location: Home  Provider Location: Office/Clinic  I discussed the limitations of evaluation and management by telemedicine. The patient expressed understanding and agreed to proceed.  Vital Signs: Because this visit was a virtual/telehealth visit, some criteria may be missing or patient reported. Any vitals not documented were not able to be obtained and vitals that have been documented are patient reported.  VideoDeclined- This patient declined Librarian, academic. Therefore the visit was completed with audio only.  Persons Participating in Visit: Patient.  AWV Questionnaire: No: Patient Medicare AWV questionnaire was not completed prior to this visit.  Cardiac Risk Factors include: advanced age (>59men, >20 women);hypertension;family history of premature cardiovascular disease     Objective:      Today's Vitals   07/09/23 1116  Weight: 143 lb (64.9 kg)  Height: 5\' 2"  (1.575 m)  PainSc: 0-No pain   Body mass index is 26.16 kg/m.     07/09/2023   11:18 AM 10/11/2022    5:25 PM 07/03/2022    7:06 PM  Advanced Directives  Does Patient Have a Medical Advance Directive? No No No  Would patient like information on creating a medical advance directive? No - Patient declined  Yes (MAU/Ambulatory/Procedural Areas - Information given)    Current Medications (verified) Outpatient Encounter Medications as of 07/09/2023  Medication Sig   amLODipine  (NORVASC ) 10 MG tablet Take 1 tablet (10 mg total) by mouth daily.   Calcium  Carb-Cholecalciferol 600-20 MG-MCG TABS    Multiple Vitamins-Minerals (ONE-A-DAY FOR HER VITACRAVES PO) Take 1 tablet by mouth once.   atorvastatin  (LIPITOR) 10 MG tablet Take 1 tablet (10 mg total) by mouth daily. (Patient not taking: Reported on 07/09/2023)   ketoconazole  (NIZORAL ) 2 % cream Apply 1 Application topically daily. (Patient not taking: Reported on 07/09/2023)   No facility-administered encounter medications on file as of 07/09/2023.    Allergies (verified) Sulfa antibiotics   History: Past Medical History:  Diagnosis Date   Hypertension    History reviewed. No pertinent surgical history. Family History  Problem Relation Age of Onset   Heart disease Mother    Thyroid  disease Mother    Breast cancer Neg Hx    Social History   Socioeconomic History   Marital status: Single    Spouse name: Not on file   Number of children: 2   Years of education: Not on file   Highest education level: Bachelor's degree (e.g.,  BA, AB, BS)  Occupational History   Not on file  Tobacco Use   Smoking status: Never   Smokeless tobacco: Never  Vaping Use   Vaping status: Never Used  Substance and Sexual Activity   Alcohol use: Yes    Alcohol/week: 3.0 standard drinks of alcohol    Types: 1 Glasses of wine, 2 Shots of liquor per week    Comment:  occasionally   Drug use: Never   Sexual activity: Not on file  Other Topics Concern   Not on file  Social History Narrative   Not on file   Social Drivers of Health   Financial Resource Strain: Low Risk  (07/09/2023)   Overall Financial Resource Strain (CARDIA)    Difficulty of Paying Living Expenses: Not hard at all  Food Insecurity: No Food Insecurity (07/09/2023)   Hunger Vital Sign    Worried About Running Out of Food in the Last Year: Never true    Ran Out of Food in the Last Year: Never true  Transportation Needs: No Transportation Needs (07/09/2023)   PRAPARE - Administrator, Civil Service (Medical): No    Lack of Transportation (Non-Medical): No  Physical Activity: Sufficiently Active (07/09/2023)   Exercise Vital Sign    Days of Exercise per Week: 7 days    Minutes of Exercise per Session: 60 min  Stress: No Stress Concern Present (07/09/2023)   [employer] of Occupational Health - Occupational Stress Questionnaire    Feeling of Stress : Not at all  Social Connections: Moderately Isolated (07/09/2023)   Social Connection and Isolation Panel [NHANES]    Frequency of Communication with Friends and Family: More than three times a week    Frequency of Social Gatherings with Friends and Family: Three times a week    Attends Religious Services: 1 to 4 times per year    Active Member of Clubs or Organizations: No    Attends Banker Meetings: Never    Marital Status: Never married    Tobacco Counseling Counseling given: Not Answered    Clinical Intake:  Pre-visit preparation completed: Yes  Pain : No/denies pain Pain Score: 0-No pain     BMI - recorded: 26.16 Nutritional Status: BMI of 19-24  Normal Nutritional Risks: None Diabetes: No  No results found for: "HGBA1C"   How often do you need to have someone help you when you read instructions, pamphlets, or other written materials from your doctor or pharmacy?: 1 - Never What is the  last grade level you completed in school?: Bachelor's Degree  Interpreter Needed?: No  Information entered by :: Brinsley Wence N. Taeler Winning, LPN.   Activities of Daily Living     07/09/2023   11:20 AM  In your present state of health, do you have any difficulty performing the following activities:  Hearing? 0  Vision? 0  Difficulty concentrating or making decisions? 0  Walking or climbing stairs? 0  Dressing or bathing? 0  Doing errands, shopping? 0  Preparing Food and eating ? N  Using the Toilet? N  In the past six months, have you accidently leaked urine? N  Do you have problems with loss of bowel control? N  Managing your Medications? N  Managing your Finances? N  Housekeeping or managing your Housekeeping? N    Patient Care Team: Lawrance Presume, MD as PCP - General (Internal Medicine) Lawson Prey, PA-C as Consulting Physician (Gastroenterology)  Indicate any recent Medical Services you may have received  from other than [facility] providers in the past year (date may be approximate).     Assessment:    This is a routine wellness examination for Mandy Shelton.  Hearing/Vision screen Hearing Screening - Comments:: Denies hearing difficulties.  Vision Screening - Comments:: Wears rx glasses for reading and distance- up to date with routine eye exams with [facility]    Goals Addressed               This Visit's Progress     Patient Stated (pt-stated)        07/09/23: My goal is to get down to 125-130 pounds and build muscle.       Depression Screen     07/09/2023   11:23 AM 10/25/2022    3:13 PM 07/03/2022    7:05 PM 05/17/2022    4:00 PM 08/31/2021    2:06 PM 04/27/2021    2:18 PM 11/10/2020    2:33 PM  PHQ 2/9 Scores  PHQ - 2 Score 0 0 0 0 0 0 0  PHQ- 9 Score 0 6   0      Fall Risk     07/09/2023   11:19 AM 07/03/2022    7:06 PM 05/17/2022    4:00 PM 11/27/2021    5:26 PM 08/31/2021    2:06 PM  Fall Risk   Falls in the past year? 0 0 0 0 0   Number falls in past yr: 0 0 0 0 0  Injury with Fall? 0 0  0 0  Risk for fall due to : No Fall Risks No Fall Risks No Fall Risks    Follow up Falls evaluation completed Falls prevention discussed;Education provided;Falls evaluation completed       MEDICARE RISK AT HOME:  Medicare Risk at Home Any stairs in or around the home?: No If so, are there any without handrails?: No Home free of loose throw rugs in walkways, pet beds, electrical cords, etc?: Yes Adequate lighting in your home to reduce risk of falls?: Yes Life alert?: No Use of a cane, walker or w/c?: No Grab bars in the bathroom?: Yes Shower chair or bench in shower?: No Elevated toilet seat or a handicapped toilet?: No  TIMED UP AND GO:  Was the test performed?  No  Cognitive Function: 6CIT completed    07/09/2023   11:23 AM  MMSE - Mini Mental State Exam  Not completed: Unable to complete        07/09/2023   11:39 AM 07/03/2022    7:06 PM  6CIT Screen  What Year? 0 points 0 points  What month? 0 points 0 points  What time? 0 points 0 points  Count back from 20 0 points 0 points  Months in reverse 0 points 0 points  Repeat phrase 0 points 0 points  Total Score 0 points 0 points    Immunizations Immunization History  Administered Date(s) Administered   Tdap 11/10/2020    Screening Tests Health Maintenance  Topic Date Due   Colonoscopy  06/20/2022   COVID-19 Vaccine (1 - 2024-25 season) Never done   Medicare Annual Wellness (AWV)  07/03/2023   Pneumonia Vaccine 50+ Years old (1 of 1 - PCV) 10/25/2023 (Originally 07/11/2006)   Zoster Vaccines- Shingrix (1 of 2) 01/20/2024 (Originally 07/11/2006)   INFLUENZA VACCINE  09/20/2023   MAMMOGRAM  04/23/2025   DTaP/Tdap/Td (2 - Td or Tdap) 11/11/2030   DEXA SCAN  Completed   Hepatitis C Screening  Completed  HPV VACCINES  Aged Out   Meningococcal B Vaccine  Aged Out    Health Maintenance  Health Maintenance Due  Topic Date Due   Colonoscopy   06/20/2022   COVID-19 Vaccine (1 - 2024-25 season) Never done   Medicare Annual Wellness (AWV)  07/03/2023   Health Maintenance Items Addressed: Yes Referral sent to GI for colonoscopy  Additional Screening:  Vision Screening: Recommended annual ophthalmology exams for early detection of glaucoma and other disorders of the eye.  Dental Screening: Recommended annual dental exams for proper oral hygiene  Community Resource Referral / Chronic Care Management: CRR required this visit?  No   CCM required this visit?  No   Plan:    I have personally reviewed and noted the following in the patient's chart:   Medical and social history Use of alcohol, tobacco or illicit drugs  Current medications and supplements including opioid prescriptions. Patient is not currently taking opioid prescriptions. Functional ability and status Nutritional status Physical activity Advanced directives List of other physicians Hospitalizations, surgeries, and ER visits in previous 12 months Vitals Screenings to include cognitive, depression, and falls Referrals and appointments  In addition, I have reviewed and discussed with patient certain preventive protocols, quality metrics, and best practice recommendations. A written personalized care plan for preventive services as well as general preventive health recommendations were provided to patient.   Margette Sheldon, LPN   2/44/0102   After Visit Summary: (MyChart) Due to this being a telephonic visit, the after visit summary with patients personalized plan was offered to patient via MyChart   Nurse Notes: Patient aware of current care gaps.  Immunization record was verified by [employer].  Patient referred for colonoscopy to Lawson Prey, PA at [facility]

## 2023-07-18 ENCOUNTER — Other Ambulatory Visit: Payer: Self-pay | Admitting: Internal Medicine

## 2023-07-18 DIAGNOSIS — I10 Essential (primary) hypertension: Secondary | ICD-10-CM

## 2023-08-18 ENCOUNTER — Other Ambulatory Visit: Payer: Self-pay | Admitting: Internal Medicine

## 2023-08-18 DIAGNOSIS — I10 Essential (primary) hypertension: Secondary | ICD-10-CM

## 2023-08-20 NOTE — Telephone Encounter (Signed)
 Requested Prescriptions  Pending Prescriptions Disp Refills   amLODipine  (NORVASC ) 10 MG tablet [Pharmacy Med Name: amLODIPine  Besylate 10 MG Oral Tablet] 90 tablet 0    Sig: TAKE 1 TABLET BY MOUTH ONCE DAILY . APPOINTMENT REQUIRED FOR FUTURE REFILLS     Cardiovascular: Calcium  Channel Blockers 2 Passed - 08/20/2023  2:10 PM      Passed - Last BP in normal range    BP Readings from Last 1 Encounters:  10/25/22 137/80         Passed - Last Heart Rate in normal range    Pulse Readings from Last 1 Encounters:  10/25/22 80         Passed - Valid encounter within last 6 months    Recent Outpatient Visits           9 months ago Essential hypertension   [facility] [place] [facility] Of [place]. [facility] Vic[facility] Barnie NOVAK, [place]   1 year ago Mixed hyperlipidemia   [place] [facility] [place] [facility] Dept Of [place]. [facility], Jon HERO, [place]   1 year ago Essential hypertension   [place] [facility] [place] [facility] Of [place]. [facility] Vicci Barnie NOVAK, MD   2 years ago Pain of right lower extremity   [place] [facility] - [facility] Dept Of [place]. [facility] Vicci Barnie NOVAK, MD   2 years ago Essential hypertension    [facility] [place] - [facility] Dept Of [place]. [facility] Fleeta Tonia Garnette LITTIE, RPH-CPP

## 2023-11-07 ENCOUNTER — Ambulatory Visit (INDEPENDENT_AMBULATORY_CARE_PROVIDER_SITE_OTHER)

## 2023-11-07 VITALS — Ht 62.0 in | Wt 143.0 lb

## 2023-11-07 DIAGNOSIS — M21611 Bunion of right foot: Secondary | ICD-10-CM

## 2023-11-07 DIAGNOSIS — M2141 Flat foot [pes planus] (acquired), right foot: Secondary | ICD-10-CM | POA: Diagnosis not present

## 2023-11-07 DIAGNOSIS — M21612 Bunion of left foot: Secondary | ICD-10-CM | POA: Diagnosis not present

## 2023-11-07 DIAGNOSIS — B353 Tinea pedis: Secondary | ICD-10-CM

## 2023-11-07 DIAGNOSIS — M2142 Flat foot [pes planus] (acquired), left foot: Secondary | ICD-10-CM

## 2023-11-07 MED ORDER — KETOCONAZOLE 2 % EX CREA: 1.0000 | TOPICAL_CREAM | Freq: Every day | CUTANEOUS | 0 refills | Status: DC

## 2023-11-07 NOTE — Progress Notes (Signed)
  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 Patient states she does not want surgery but other ideas to help manage    68 y.o. female presents for concern as above . Denies any other pedal complaints. Denies n/v/f/c.   Past Medical History:  Diagnosis Date   Hypertension     Objective:  Physical Exam: Vascular: DP/PT pulses 2/4 bilateral. CFT <3 seconds. Normal hair growth on digits. No edema.  Skin. No lacerations or abrasions bilateral feet. Minor peeling plantar aspect of bilateral feet in moccasin distrubution.  Musculoskeletal: MMT 5/5 bilateral lower extremities in DF, PF, Inversion and Eversion. Deceased ROM in DF of ankle joint. 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 No pain with ROM of the joints. Difficult to reduce deformity and mild hypermobility of the first MPJ. Flexible pes planus foot shape. Hyperkeratotic lesion on lateral aspect of 5th digit from 4th digit rubbing. No sign of infection.  Neurological: Sensation intact to light touch.   Assessment:   1. Bilateral bunions   2. Tinea pedis of both feet   3. Bilateral pes planus      Plan:  Patient was evaluated and treated and all questions answered. -Previously performed XR reviewed. 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 IM 1-2 angle on left is about 14 degrees and on right about 15-16 degrees.  -Discussed HAV and treatment options;conservative and surgical management; risks, benefits, alternatives discussed. All patient's questions answered. -Discussed padding and wide shoe gear.  Silicone toe sleeves for right hallux and 4th toe, and left hallux were dispensed today to decrease rubbing.  -Discussed orthotics for her flexible flatfoot. Discussed  recommendation for custom molded orthotics to best match patient's anatomy and hold foot in a corrected position.  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  Patient will call her insurance to find out price prior to being fitted for custom molded orthotics. If she is unable to get custom orthotics she will pursue stable over the counter orthotics. -Recommend continue with good supportive shoes and inserts.  -Discussed surgical option in the future and would likely consider lapidus vs MPJ fusion for her. She is not ready for surgery at this time.   -Discussed tinea pedis, order for ketoconazole  placed today. She would like to avoid oral medications -Patient to return to office as needed or sooner if condition worsens.   Prentice Ovens, DPM

## 2023-11-08 ENCOUNTER — Other Ambulatory Visit: Payer: Self-pay

## 2023-11-08 NOTE — Telephone Encounter (Signed)
 Copied from CRM [id number]. Topic: General - Other >> Nov 08, 2023  1:38 PM Delon DASEN wrote: Reason for CRM: Kyra with [employer] advising patient has a care plan on [employer] website and it is ready to view

## 2023-11-16 ENCOUNTER — Other Ambulatory Visit: Payer: Self-pay | Admitting: Internal Medicine

## 2023-11-16 DIAGNOSIS — I10 Essential (primary) hypertension: Secondary | ICD-10-CM

## 2023-12-06 ENCOUNTER — Other Ambulatory Visit: Payer: Self-pay | Admitting: Internal Medicine

## 2023-12-06 DIAGNOSIS — I10 Essential (primary) hypertension: Secondary | ICD-10-CM

## 2023-12-06 NOTE — Telephone Encounter (Signed)
 Copied from CRM [id number]. Topic: Clinical - Medication Refill >> Dec 06, 2023 10:01 AM Rosaria BRAVO wrote: Medication: amLODipine  (NORVASC ) 10 MG tablet  Has the patient contacted their pharmacy? Yes (Agent: If no, request that the patient contact the pharmacy for the refill. If patient does not wish to contact the pharmacy document the reason why and proceed with request.) (Agent: If yes, when and what did the pharmacy advise?)  This is the patient's preferred pharmacy:  [facility] [address] [place] - [address] [place]  [address] Phone: [phone] Fax: [phone]  Is this the correct pharmacy for this prescription? Yes If no, delete pharmacy and type the correct one.   Has the prescription been filled recently? Yes  Is the patient out of the medication? No.   Has the patient been seen for an appointment in the last year OR does the patient have an upcoming appointment? Yes  Can we respond through MyChart? Yes  Agent: Please be advised that Rx refills may take up to 3 business days. We ask that you follow-up with your pharmacy.

## 2023-12-09 MED ORDER — AMLODIPINE BESYLATE 10 MG PO TABS
10.0000 mg | ORAL_TABLET | Freq: Every day | ORAL | 0 refills | Status: AC
Start: 2023-12-09 — End: ?

## 2023-12-09 NOTE — Telephone Encounter (Signed)
 Requested medications are due for refill today.  yes  Requested medications are on the active medications list.  yes  Last refill. 08/20/2023 #90 0 rf  Future visit scheduled.   Yes in 2026  Notes to clinic.  Pt last seen 10/25/2022     Requested Prescriptions  Pending Prescriptions Disp Refills   amLODipine  (NORVASC ) 10 MG tablet 90 tablet 0     Cardiovascular: Calcium  Channel Blockers 2 Passed - 12/09/2023  2:02 PM      Passed - Last BP in normal range    BP Readings from Last 1 Encounters:  10/25/22 137/80         Passed - Last Heart Rate in normal range    Pulse Readings from Last 1 Encounters:  10/25/22 80         Passed - Valid encounter within last 6 months    Recent Outpatient Visits           1 year ago Essential hypertension   [place] [facility] [place] [facility] Of [place]. [facility] Barnie NOVAK, [place]   1 year ago Mixed hyperlipidemia   [place] [facility] [place] - [facility] Dept Of [place]. [facility] [place], Jon HERO, [place]   2 years ago Essential hypertension   [place] [facility] [place] - [facility] Dept Of [place]. [facility] Vicci Barnie NOVAK, MD   2 years ago Pain of right lower extremity   [place] [facility] - A Dept Of [place]. [facility] Vicci Barnie NOVAK, MD   2 years ago Essential hypertension    [facility] [place] - A Dept Of [place]. [facility] Fleeta Tonia Garnette LITTIE, RPH-CPP

## 2024-02-22 ENCOUNTER — Ambulatory Visit
Admission: EM | Admit: 2024-02-22 | Discharge: 2024-02-22 | Disposition: A | Discharge status: Home / Self Care | Attending: Family Medicine | Admitting: Family Medicine

## 2024-02-22 DIAGNOSIS — U071 COVID-19: Secondary | ICD-10-CM

## 2024-02-22 MED ORDER — PAXLOVID (300/100) 20 X 150 MG & 10 X 100MG PO TBPK: ORAL_TABLET | ORAL | 0 refills | Status: AC

## 2024-02-22 MED ORDER — PROMETHAZINE-DM 6.25-15 MG/5ML PO SYRP: 5.0000 mL | ORAL_SOLUTION | Freq: Three times a day (TID) | ORAL | 0 refills | Status: AC | PRN

## 2024-02-22 NOTE — Discharge Instructions (Signed)
 Start Paxlovid  for your COVID infection. I checked the formulary for Medicaid today and should be covered. For sore throat or cough try using a honey-based tea. Use 3 teaspoons of honey with juice squeezed from half lemon. Place shaved pieces of ginger into 1/2-1 cup of water and warm over stove top. Then mix the ingredients and repeat every 4 hours as needed. Please take Tylenol  500mg -650mg  once every 6 hours for fevers, aches and pains. Hydrate very well with at least 2 liters (64 ounces) of water. Eat light meals such as soups (chicken and noodles, chicken wild rice, vegetable).  Do not eat any foods that you are allergic to.  Start an antihistamine like Zyrtec (10mg  daily) for postnasal drainage, sinus congestion.  You can take this together with pseudoephedrine (Sudafed) at a dose of 30 mg 3 times a day or twice daily as needed for the same kind of congestion.  Use cough syrup as needed.

## 2024-02-22 NOTE — ED Provider Notes (Signed)
 " [facility]  Note:  This document was prepared using Conservation officer, historic buildings and may include unintentional dictation errors.  MRN: [id number] DOB: 1955/09/01  Subjective:   Mandy Shelton is a 69 y.o. female presenting for COVID-19 infection.  Patient did a COVID test at home and was positive.  Would like Paxlovid .  Has had a scratchy sore throat, coughing, malaise, fatigue, headaches.  No chest pain, shortness of breath or wheezing.  No asthma.  No smoking of any kind including cigarettes, cigars, vaping, marijuana use.  Has hypertension and is supposed to take blood pressure medicine but did not take it today.  No history of CKD per patient, declines blood work.   Current Outpatient Medications  Medication Instructions   amLODipine  (NORVASC ) 10 mg, Oral, Daily, Must have office visit for refills   atorvastatin  (LIPITOR) 10 mg, Oral, Daily   Calcium  Carb-Cholecalciferol 600-20 MG-MCG TABS    ketoconazole  (NIZORAL ) 2 % cream 1 Application, Topical, Daily   ketoconazole  (NIZORAL ) 2 % cream Apply 1 g to affect skin topically daily.   Multiple Vitamins-Minerals (ONE-A-DAY FOR HER VITACRAVES PO) 1 tablet,  Once    Allergies[1]  Past Medical History:  Diagnosis Date   Hypertension      History reviewed. No pertinent surgical history.  Family History  Problem Relation Age of Onset   Heart disease Mother    Thyroid  disease Mother    Breast cancer Neg Hx     Social History   Occupational History   Not on file  Tobacco Use   Smoking status: Never   Smokeless tobacco: Never  Vaping Use   Vaping status: Never Used  Substance and Sexual Activity   Alcohol use: Yes    Alcohol/week: 3.0 standard drinks of alcohol    Types: 1 Glasses of wine, 2 Shots of liquor per week    Comment: occasionally   Drug use: Never   Sexual activity: Not Currently    Birth control/protection: Post-menopausal     ROS   Objective:   Vitals: BP (!) 156/71 (BP  Location: Right Arm)   Pulse (!) 101   Temp (!) 100.7 F (38.2 C) (Oral)   Resp 18   SpO2 96%   Physical Exam Constitutional:      General: She is not in acute distress.    Appearance: Normal appearance. She is well-developed. She is not ill-appearing, toxic-appearing or diaphoretic.  HENT:     Head: Normocephalic and atraumatic.     Nose: Nose normal.     Mouth/Throat:     Mouth: Mucous membranes are moist.  Eyes:     General: No scleral icterus.       Right eye: No discharge.        Left eye: No discharge.     Extraocular Movements: Extraocular movements intact.  Cardiovascular:     Rate and Rhythm: Normal rate and regular rhythm.     Heart sounds: Normal heart sounds. No murmur heard.    No friction rub. No gallop.  Pulmonary:     Effort: Pulmonary effort is normal. No respiratory distress.     Breath sounds: No stridor. No wheezing, rhonchi or rales.  Chest:     Chest wall: No tenderness.  Skin:    General: Skin is warm and dry.  Neurological:     General: No focal deficit present.     Mental Status: She is alert and oriented to person, place, and time.  Cranial Nerves: No cranial nerve deficit.     Motor: No weakness.     Coordination: Coordination normal.     Gait: Gait normal.     Comments: No facial asymmetry, negative Romberg and pronator drift.  Psychiatric:        Mood and Affect: Mood normal.        Behavior: Behavior normal.     Assessment and Plan :   PDMP not reviewed this encounter.  1. COVID-19      Patient declined repeat testing, confirmatory testing in clinic.  Prescribed Paxlovid  to help with her COVID-19 infection.  Recommend supportive care otherwise.  Deferred imaging given clear pulmonary exam.  Counseled patient on potential for adverse effects with medications prescribed/recommended today, ER and return-to-clinic precautions discussed, patient verbalized understanding.     [1]  Allergies Allergen Reactions   Sulfa Antibiotics       Christopher Savannah, PA-C 02/22/24 1521  "

## 2024-02-22 NOTE — ED Triage Notes (Signed)
 Pt reports headache, feeling horrible, scratchy throat, cough started today.   Pt reports she had a positive COVID test today.

## 2024-07-14 ENCOUNTER — Ambulatory Visit
